# Patient Record
Sex: Male | Born: 1965 | Race: Black or African American | Hispanic: No | Marital: Married | Smoking: Never smoker
Health system: Southern US, Community
[De-identification: ages and names within clinical notes are randomized; demographics above are authoritative.]

## PROBLEM LIST (undated history)

## (undated) DIAGNOSIS — G4733 Obstructive sleep apnea (adult) (pediatric): Secondary | ICD-10-CM

## (undated) DIAGNOSIS — T7840XA Allergy, unspecified, initial encounter: Secondary | ICD-10-CM

## (undated) DIAGNOSIS — Z800 Family history of malignant neoplasm of digestive organs: Secondary | ICD-10-CM

## (undated) DIAGNOSIS — I10 Essential (primary) hypertension: Secondary | ICD-10-CM

## (undated) DIAGNOSIS — E669 Obesity, unspecified: Secondary | ICD-10-CM

## (undated) DIAGNOSIS — C189 Malignant neoplasm of colon, unspecified: Secondary | ICD-10-CM

## (undated) DIAGNOSIS — E785 Hyperlipidemia, unspecified: Secondary | ICD-10-CM

## (undated) DIAGNOSIS — G4730 Sleep apnea, unspecified: Secondary | ICD-10-CM

## (undated) DIAGNOSIS — Z9989 Dependence on other enabling machines and devices: Secondary | ICD-10-CM

## (undated) HISTORY — DX: Obesity, unspecified: E66.9

## (undated) HISTORY — DX: Essential (primary) hypertension: I10

## (undated) HISTORY — DX: Obstructive sleep apnea (adult) (pediatric): G47.33

## (undated) HISTORY — DX: Dependence on other enabling machines and devices: Z99.89

## (undated) HISTORY — DX: Hyperlipidemia, unspecified: E78.5

## (undated) HISTORY — PX: WISDOM TOOTH EXTRACTION: SHX21

## (undated) HISTORY — DX: Allergy, unspecified, initial encounter: T78.40XA

## (undated) HISTORY — DX: Malignant neoplasm of colon, unspecified: C18.9

## (undated) HISTORY — DX: Sleep apnea, unspecified: G47.30

## (undated) HISTORY — DX: Family history of malignant neoplasm of digestive organs: Z80.0

---

## 2010-12-21 ENCOUNTER — Encounter: Payer: Self-pay | Admitting: Medical

## 2010-12-21 ENCOUNTER — Ambulatory Visit (INDEPENDENT_AMBULATORY_CARE_PROVIDER_SITE_OTHER): Payer: BC Managed Care – PPO | Admitting: Medical

## 2010-12-21 DIAGNOSIS — Z0000 Encounter for general adult medical examination without abnormal findings: Secondary | ICD-10-CM

## 2010-12-21 DIAGNOSIS — I10 Essential (primary) hypertension: Secondary | ICD-10-CM

## 2010-12-21 LAB — POCT URINALYSIS DIPSTICK
Blood, UA: NEGATIVE
Leukocytes, UA: NEGATIVE
Nitrite, UA: NEGATIVE
Protein, UA: NEGATIVE
pH, UA: 6

## 2010-12-21 LAB — CBC WITH DIFFERENTIAL/PLATELET
Eosinophils Relative: 2 % (ref 0–5)
HCT: 47.1 % (ref 39.0–52.0)
Hemoglobin: 16.1 g/dL (ref 13.0–17.0)
Lymphocytes Relative: 46 % (ref 12–46)
Lymphs Abs: 2.2 10*3/uL (ref 0.7–4.0)
MCV: 86.3 fL (ref 78.0–100.0)
Monocytes Absolute: 0.7 10*3/uL (ref 0.1–1.0)
Monocytes Relative: 14 % — ABNORMAL HIGH (ref 3–12)
RBC: 5.46 MIL/uL (ref 4.22–5.81)
RDW: 13.6 % (ref 11.5–15.5)
WBC: 4.9 10*3/uL (ref 4.0–10.5)

## 2010-12-21 LAB — LIPID PANEL
HDL: 45 mg/dL (ref >39)
LDL Cholesterol: 106 mg/dL — ABNORMAL HIGH (ref 0–99)
Total CHOL/HDL Ratio: 3.9 Ratio

## 2010-12-21 LAB — COMPREHENSIVE METABOLIC PANEL
ALT: 28 U/L (ref 0–53)
CO2: 25 mEq/L (ref 19–32)
Creat: 0.94 mg/dL (ref 0.50–1.35)
Total Bilirubin: 1 mg/dL (ref 0.3–1.2)

## 2010-12-21 MED ORDER — HYDROCHLOROTHIAZIDE 25 MG PO TABS: 25.0000 mg | ORAL_TABLET | Freq: Every day | ORAL | Status: DC

## 2010-12-21 NOTE — Progress Notes (Signed)
Subjective:   HPI  Edward Patterson is a 45 y.o. male who presents for a complete physical.  Was seeing family doctor in [place] prior that had him on HCTZ, but he has been out of this for months.  Otherwise he has been lifting weight, working, and in normal state of health.  No other c/o.   Reviewed their medical, surgical, family, social, medication, and allergy history and updated chart as appropriate.  Past Medical History  Diagnosis Date  . Hypertension   . Obstructive sleep apnea on CPAP     CPAP  . Allergy     History reviewed. No pertinent past surgical history.  Family History  Problem Relation Age of Onset  . Hypertension Mother   . Diabetes Maternal Aunt   . Cancer Neg Hx   . Heart disease Neg Hx   . Stroke Neg Hx   . Hyperlipidemia Neg Hx     History   Social History  . Marital Status: Single    Spouse Name: N/A    Number of Children: N/A  . Years of Education: N/A   Occupational History  . Not on file.   Social History Main Topics  . Smoking status: Never Smoker   . Smokeless tobacco: Never Used  . Alcohol Use: 3.0 oz/week    6 drink(s) per week  . Drug Use: No  . Sexually Active: Not on file     single, engaged, 1 child, exercise 3-4 days/wk, weights, walks a lot at work, works in housekeeping at [employer]; prior surgical tech experience   Other Topics Concern  . Not on file   Social History Narrative  . No narrative on file    No current outpatient prescriptions on file prior to visit.    No Known Allergies   Review of Systems Constitutional: denies fever, chills, sweats, unexpected weight change, anorexia, fatigue Allergy: negative; denies recent sneezing, itching, congestion Dermatology: denies changing moles, rash, lumps, new worrisome lesions ENT: no runny nose, ear pain, sore throat, hoarseness, sinus pain, teeth pain, tinnitus, hearing loss, epistaxis Cardiology: denies chest pain, palpitations, edema, orthopnea, paroxysmal nocturnal  dyspnea Respiratory: denies cough, shortness of breath, dyspnea on exertion, wheezing, hemoptysis Gastroenterology: denies abdominal pain, nausea, vomiting, diarrhea, constipation, blood in stool, changes in bowel movement, dysphagia Hematology: denies bleeding or bruising problems Musculoskeletal: denies arthralgias, myalgias, joint swelling, back pain, neck pain, cramping, gait changes Ophthalmology: denies vision changes, eye redness, itching, discharge Urology: denies dysuria, difficulty urinating, hematuria, urinary frequency, urgency, incontinence Neurology: no headache, weakness, tingling, numbness, speech abnormality, memory loss, falls, dizziness Psychology: denies depressed mood, agitation, sleep problems     Objective:   Physical Exam  Filed Vitals:   12/21/10 1354  BP: 130/90  Pulse: 88  Temp: 98.1 F (36.7 C)  Resp: 20    General appearance: alert, no distress, WD/WN, AA male, overweight Skin: warm, dry, few brown raised benign appearing lesions, small of left face HEENT: normocephalic, conjunctiva/corneas normal, sclerae anicteric, PERRLA, EOMi, nares patent, no discharge or erythema, pharynx normal Oral cavity: MMM, tongue normal, teeth normal Neck: supple, no lymphadenopathy, no thyromegaly, no masses, normal ROM, no JVD or bruits Chest: non tender, normal shape and expansion Heart: RRR, normal S1, S2, no murmurs Lungs: CTA bilaterally, no wheezes, rhonchi, or rales Abdomen: +bs, soft, non tender, non distended, no masses, no hepatomegaly, no splenomegaly, no bruits Back: non tender, normal ROM, no scoliosis Musculoskeletal: upper extremities non tender, no obvious deformity, normal ROM throughout, lower extremities  non tender, no obvious deformity, normal ROM throughout Extremities: no edema, no cyanosis, no clubbing Pulses: 2+ symmetric, upper and lower extremities, normal cap refill Neurological: alert, oriented x 3, CN2-12 intact, strength normal upper  extremities and lower extremities, sensation normal throughout, DTRs 2+ throughout, no cerebellar signs, gait normal Psychiatric: normal affect, behavior normal, pleasant  GU: nontender, no hernia, no mass Rectal: anus normal appearing, prostate WNL, no masses, occult negative stool  Assessment :    Encounter Diagnoses  Name Primary?  . General medical examination Yes  . Essential hypertension, benign     Plan:    Physical exam - discussed healthy lifestyle, diet, exercise, preventative care, vaccinations, and addressed their concerns.  Advised he use lifestyle changes to lose weight.  Will request records from prior practice for office notes, labs, and what sounds like prior sigmoidoscopy for blood in stool a few years ago.  He notes last Tdap updated within the last few years.  HTN - restart HCTZ.  He has f/u with his ophthalmologist tomorrow.   Will call with lab results.

## 2010-12-21 NOTE — Patient Instructions (Signed)
Preventative Care for Adults, Male       REGULAR HEALTH EXAMS:  A routine yearly physical is a good way to check in with your primary care provider about your health and preventive screening. It is also an opportunity to share updates about your health and any concerns you have, and receive a thorough all-over exam.   Most health insurance companies pay for at least some preventative services.  Check with your health plan for specific coverages.  WHAT PREVENTATIVE SERVICES DO MEN NEED?  Adult men should have their weight and blood pressure checked regularly.   Men age 35 and older should have their cholesterol levels checked regularly.  Beginning at age 50 and continuing to age 75, men should be screened for colorectal cancer.  Certain people should may need continued testing until age 85.  Other cancer screening may include exams for testicular and prostate cancer.  Updating vaccinations is part of preventative care.  Vaccinations help protect against diseases such as the flu.  Lab tests are generally done as part of preventative care to screen for anemia and blood disorders, to screen for problems with the kidneys and liver, to screen for bladder problems, to check blood sugar, and to check your cholesterol level.  Preventative services generally include counseling about diet, exercise, avoiding tobacco, drugs, excessive alcohol consumption, and sexually transmitted infections.    GENERAL RECOMMENDATIONS FOR GOOD HEALTH:  Healthy diet:  Eat a variety of foods, including fruit, vegetables, animal or vegetable protein, such as meat, fish, chicken, and eggs, or beans, lentils, tofu, and grains, such as rice.  Drink plenty of water daily.  Decrease saturated fat in the diet, avoid lots of red meat, processed foods, sweets, fast foods, and fried foods.  Exercise:  Aerobic exercise helps maintain good heart health. At least 30-40 minutes of moderate-intensity exercise is recommended.  For example, a brisk walk that increases your heart rate and breathing. This should be done on most days of the week.   Find a type of exercise or a variety of exercises that you enjoy so that it becomes a part of your daily life.  Examples are running, walking, swimming, water aerobics, and biking.  For motivation and support, explore group exercise such as aerobic class, spin class, Zumba, Yoga,or  martial arts, etc.    Set exercise goals for yourself, such as a certain weight goal, walk or run in a race such as a 5k walk/run.  Speak to your primary care provider about exercise goals.  Disease prevention:  If you smoke or chew tobacco, find out from your caregiver how to quit. It can literally save your life, no matter how long you have been a tobacco user. If you do not use tobacco, never begin.   Maintain a healthy diet and normal weight. Increased weight leads to problems with blood pressure and diabetes.   The Body Mass Index or BMI is a way of measuring how much of your body is fat. Having a BMI above 27 increases the risk of heart disease, diabetes, hypertension, stroke and other problems related to obesity. Your caregiver can help determine your BMI and based on it develop an exercise and dietary program to help you achieve or maintain this important measurement at a healthful level.  High blood pressure causes heart and blood vessel problems.  Persistent high blood pressure should be treated with medicine if weight loss and exercise do not work.   Fat and cholesterol leaves deposits in your arteries   that can block them. This causes heart disease and vessel disease elsewhere in your body.  If your cholesterol is found to be high, or if you have heart disease or certain other medical conditions, then you may need to have your cholesterol monitored frequently and be treated with medication.   Ask if you should have a stress test if your history suggests this. A stress test is a test done on  a treadmill that looks for heart disease. This test can find disease prior to there being a problem.  Avoid drinking alcohol in excess (more than two drinks per day).  Avoid use of street drugs. Do not share needles with anyone. Ask for professional help if you need assistance or instructions on stopping the use of alcohol, cigarettes, and/or drugs.  Brush your teeth twice a day with fluoride toothpaste, and floss once a day. Good oral hygiene prevents tooth decay and gum disease. The problems can be painful, unattractive, and can cause other health problems. Visit your dentist for a routine oral and dental check up and preventive care every 6-12 months.   Look at your skin regularly.  Use a mirror to look at your back. Notify your caregivers of changes in moles, especially if there are changes in shapes, colors, a size larger than a pencil eraser, an irregular border, or development of new moles.  Safety:  Use seatbelts 100% of the time, whether driving or as a passenger.  Use safety devices such as hearing protection if you work in environments with loud noise or significant background noise.  Use safety glasses when doing any work that could send debris in to the eyes.  Use a helmet if you ride a bike or motorcycle.  Use appropriate safety gear for contact sports.  Talk to your caregiver about gun safety.  Use sunscreen with a SPF (or skin protection factor) of 15 or greater.  Lighter skinned people are at a greater risk of skin cancer. Don't forget to also wear sunglasses in order to protect your eyes from too much damaging sunlight. Damaging sunlight can accelerate cataract formation.   Practice safe sex. Use condoms. Condoms are used for birth control and to help reduce the spread of sexually transmitted infections (or STIs).  Some of the STIs are gonorrhea (the clap), chlamydia, syphilis, trichomonas, herpes, HPV (human papilloma virus) and HIV (human immunodeficiency virus) which causes AIDS.  The herpes, HIV and HPV are viral illnesses that have no cure. These can result in disability, cancer and death.   Keep carbon monoxide and smoke detectors in your home functioning at all times. Change the batteries every 6 months or use a model that plugs into the wall.   Vaccinations:  Stay up to date with your tetanus shots and other required immunizations. You should have a booster for tetanus every 10 years. Be sure to get your flu shot every year, since 5%-20% of the [place] population comes down with the flu. The flu vaccine changes each year, so being vaccinated once is not enough. Get your shot in the fall, before the flu season peaks.   Other vaccines to consider:  Pneumococcal vaccine to protect against certain types of pneumonia.  This is normally recommended for adults age 65 or older.  However, adults younger than 47 years old with certain underlying conditions such as diabetes, heart or lung disease should also receive the vaccine.  Shingles vaccine to protect against Varicella Zoster if you are older than age 60, or younger   than 47 years old with certain underlying illness.  Hepatitis A vaccine to protect against a form of infection of the liver by a virus acquired from food.  Hepatitis B vaccine to protect against a form of infection of the liver by a virus acquired from blood or body fluids, particularly if you work in health care.  If you plan to travel internationally, check with your local health department for specific vaccination recommendations.  Cancer Screening:  Most routine colon cancer screening begins at the age of 50. On a yearly basis, doctors may provide special easy to use take-home tests to check for hidden blood in the stool. Sigmoidoscopy or colonoscopy can detect the earliest forms of colon cancer and is life saving. These tests use a small camera at the end of a tube to directly examine the colon. Speak to your caregiver about this at age 50, when routine  screening begins (and is repeated every 5 years unless early forms of pre-cancerous polyps or small growths are found).   At the age of 50 men usually start screening for prostate cancer every year. Screening may begin at a younger age for those with higher risk. Those at higher risk include African-Americans or having a family history of prostate cancer. There are two types of tests for prostate cancer:   Prostate-specific antigen (PSA) testing. Recent studies raise questions about prostate cancer using PSA and you should discuss this with your caregiver.   Digital rectal exam (in which your doctor's lubricated and gloved finger feels for enlargement of the prostate through the anus).   Screening for testicular cancer.  Do a monthly exam of your testicles. Gently roll each testicle between your thumb and fingers, feeling for any abnormal lumps. The best time to do this is after a hot shower or bath when the tissues are looser. Notify your caregivers of any lumps, tenderness or changes in size or shape immediately.     

## 2010-12-23 ENCOUNTER — Telehealth: Payer: Self-pay | Admitting: Medical

## 2010-12-23 NOTE — Telephone Encounter (Addendum)
Message copied by Dorthula Perfect on Fri Dec 23, 2010  1:01 PM ------      Message from: Waldo, DAVID S      Created: Fri Dec 23, 2010  7:45 AM       Labs all normal (liver, kidney, electrolytes, urine, thyroid, cholesterol).  Ask him to take his BP med daily, work on diet and cardiac exercise in addition to weight training to help lose weight.  I want to see him back in 29mo on BP and weight.   Pt notified of lab results and was advised to take BP medication and to exercise for weight lose.  Pt scheduled on 03-20-11 at 2:30pm for follow up on BP and weight lose.  CM, LPN

## 2010-12-23 NOTE — Telephone Encounter (Signed)
Pt wants lab results and wants to know if you recd correspondence from Dr. Vedia Coffer

## 2011-03-21 ENCOUNTER — Encounter: Payer: Self-pay | Admitting: Medical

## 2011-03-21 ENCOUNTER — Ambulatory Visit (INDEPENDENT_AMBULATORY_CARE_PROVIDER_SITE_OTHER): Payer: BC Managed Care – PPO | Admitting: Medical

## 2011-03-21 VITALS — BP 160/90 | HR 84 | Temp 98.1°F | Resp 18 | Wt 268.0 lb

## 2011-03-21 DIAGNOSIS — E669 Obesity, unspecified: Secondary | ICD-10-CM | POA: Insufficient documentation

## 2011-03-21 DIAGNOSIS — I10 Essential (primary) hypertension: Secondary | ICD-10-CM

## 2011-03-21 MED ORDER — TRIAMTERENE-HCTZ 37.5-25 MG PO CAPS: 1.0000 | ORAL_CAPSULE | Freq: Every day | ORAL | Status: DC

## 2011-03-21 MED ORDER — NEBIVOLOL HCL 5 MG PO TABS: 5.0000 mg | ORAL_TABLET | Freq: Every day | ORAL | Status: DC

## 2011-03-21 NOTE — Progress Notes (Signed)
Subjective:   HPI  Edward Patterson is a 45 y.o. male who presents for recheck on blood pressure and weight.   At his visit here in August for complete physical, I started him back on HCTZ 25mg  daily.  We received prior records since last visit.  He is taking the HCTZ 25mg  daily, but since last visit hasn't been able to make significant lifestyle changes.  Goes into work 5am, goes to gym some in the mornings.  He recently changed his schedule and job, and not able to exercise like he was.  Not adherent to low salt diet.  He has made some diet changes though.  He is using his CPAP machine.   No other aggravating or relieving factors.    No other c/o.  The following portions of the patient's history were reviewed and updated as appropriate: allergies, current medications, past family history, past medical history, past social history, past surgical history and problem list.  Past Medical History  Diagnosis Date  . Hypertension   . Obstructive sleep apnea on CPAP     CPAP  . Allergy     Review of Systems Constitutional: -fever, -chills, -sweats, -unexpected -weight change,-fatigue ENT: -runny nose, -ear pain, -sore throat Cardiology:  -chest pain, -palpitations, -edema Respiratory: -cough, -shortness of breath, -wheezing Gastroenterology: -abdominal pain, +nausea, -vomiting, -diarrhea, -constipation Hematology: -bleeding or bruising problems Musculoskeletal: -arthralgias, -myalgias, -joint swelling, -back pain Ophthalmology: +vision changes Urology: -dysuria, -difficulty urinating, -hematuria, -urinary frequency, -urgency Neurology: -headache, -weakness, -tingling, -numbness    Objective:   Physical Exam  Filed Vitals:   03/21/11 1425  BP: 160/90  Pulse: 84  Temp: 98.1 F (36.7 C)  Resp: 18    General appearance: alert, no distress, WD/WN, obese  Oral cavity: MMM, no lesions Neck: supple, no lymphadenopathy, no thyromegaly, no masses Heart: RRR, normal S1, S2, no  murmurs Lungs: CTA bilaterally, no wheezes, rhonchi, or rales Pulses: 2+ symmetric, upper and lower extremities, normal cap refill   Assessment and Plan :    Encounter Diagnoses  Name Primary?  Marland Kitchen Unspecified essential hypertension Yes  . Obesity    HTN - again today spent lots of time discussing diet, healthy eating, exercise, low salt diet, and medications.  I reviewed his prior medications, and he had been on Bystolic 5 mg a day and Dyazide 37.5/25mg  daily. We will stop his current HCTZ and restart his prior regimen which apparently had worked better.  Obesity - spent a lot of time talking about diet and exercise today.  He agrees to work harder on this, we'll make some necessary changes, and recheck here in one month on blood pressure and weight.   Follow-up  21mo

## 2011-03-21 NOTE — Patient Instructions (Signed)
Stop Hydrochlorothiazide 25mg  daily.  Begin Dyazide 37.5/25mg  combo once daily in the morning.  After 1 week if no problems, begin Bystolic 5mg  at bedtime.  Lets recheck your blood pressure again in 1 month here.  Try these diet modifications:  Try and eat raw or cooked vegetable each meal, especially green leafy vegetables.  Try and make this the MAIN DISH  Eat 4 servings of fruit daily  Eat 1 cup of beans daily  Eat 1 serving of whole grains daily such as oatmeal  Limit animal products such as meat, milk, cheese  AVOID salt  Stay away from potatoes, salt, fast food, fried foods, candy, sweets.  Lets make these changes, and really work on your diet and exercise to lose weight.

## 2011-03-22 ENCOUNTER — Encounter: Payer: Self-pay | Admitting: Family Medicine

## 2011-04-10 ENCOUNTER — Emergency Department (INDEPENDENT_AMBULATORY_CARE_PROVIDER_SITE_OTHER)
Admission: EM | Admit: 2011-04-10 | Discharge: 2011-04-10 | Disposition: A | Discharge status: Home / Self Care | Payer: BC Managed Care – PPO | Source: Home / Self Care

## 2011-04-10 ENCOUNTER — Encounter (HOSPITAL_COMMUNITY): Payer: Self-pay

## 2011-04-10 DIAGNOSIS — R040 Epistaxis: Secondary | ICD-10-CM

## 2011-04-10 NOTE — ED Notes (Signed)
States he has a dry nose and haas been having a nosebleed since last PM; uses C-PAP at night ; worried since bleeding was hard to stop this AM

## 2011-04-10 NOTE — ED Provider Notes (Signed)
History     CSN: [id number] Arrival date & time: 04/10/2011 11:30 AM   None     Chief Complaint  Patient presents with  . Epistaxis    (Consider location/radiation/quality/duration/timing/severity/associated sxs/prior treatment) HPI Comments: Pt states he had noticed that the Lt side of his nose was tender the last couple of days. Had some minor  bleeding during the night last night from Lt nostril, and then this morning had an active nosebleed. He wears a CPAP at night with a humidifier, but does not typically have problems with nosebleeds. No nasal congestion symptoms.   Patient is a 45 y.o. male presenting with nosebleeds. The history is provided by the patient.  Epistaxis  This is a new problem. The current episode started 6 to 12 hours ago. Episode frequency: once. The problem has been resolved. The problem is associated with an unknown factor. The bleeding has been from the left nare. He has tried nothing for the symptoms. His past medical history does not include bleeding disorder, colds, nose-picking or frequent nosebleeds.    Past Medical History  Diagnosis Date  . Hypertension   . Obstructive sleep apnea on CPAP     CPAP  . Allergy     History reviewed. No pertinent past surgical history.  Family History  Problem Relation Age of Onset  . Hypertension Mother   . Diabetes Maternal Aunt   . Cancer Neg Hx   . Heart disease Neg Hx   . Stroke Neg Hx   . Hyperlipidemia Neg Hx     History  Substance Use Topics  . Smoking status: Never Smoker   . Smokeless tobacco: Never Used  . Alcohol Use: 3.0 oz/week    6 drink(s) per week      Review of Systems  Constitutional: Negative for fever and chills.  HENT: Positive for nosebleeds. Negative for ear pain, congestion, rhinorrhea and postnasal drip.   Respiratory: Negative for cough.     Allergies  Review of patient's allergies indicates no known allergies.  Home Medications   Current Outpatient Rx  Name Route  Sig Dispense Refill  . NEBIVOLOL HCL 5 MG PO TABS Oral Take 1 tablet (5 mg total) by mouth daily. 30 tablet 3  . TRIAMTERENE-HCTZ 37.5-25 MG PO CAPS Oral Take 1 each (1 capsule total) by mouth daily. 30 capsule 3    BP 150/69  Pulse 84  Temp(Src) 98.5 F (36.9 C) (Oral)  Resp 16  SpO2 99%  Physical Exam  Nursing note and vitals reviewed. Constitutional: He appears well-developed and well-nourished. No distress.  HENT:  Head: Normocephalic and atraumatic.  Right Ear: Tympanic membrane, external ear and ear canal normal.  Left Ear: Tympanic membrane, external ear and ear canal normal.  Nose: Nose normal. No mucosal edema, rhinorrhea, nasal deformity or septal deviation. No epistaxis.    Mouth/Throat: Uvula is midline, oropharynx is clear and moist and mucous membranes are normal. No oropharyngeal exudate, posterior oropharyngeal edema or posterior oropharyngeal erythema.  Neck: Neck supple.  Cardiovascular: Normal rate, regular rhythm and normal heart sounds.   Pulmonary/Chest: Effort normal and breath sounds normal. No respiratory distress.  Lymphadenopathy:    He has no cervical adenopathy.  Neurological: He is alert.  Skin: Skin is warm and dry.  Psychiatric: He has a normal mood and affect.    ED Course  EPISTAXIS MANAGEMENT Date/Time: 04/10/2011 12:36 PM Performed by: Melody Comas Authorized by: Melody Comas Consent: Verbal consent obtained. Written consent not obtained. Risks  and benefits: risks, benefits and alternatives were discussed Consent given by: patient Patient understanding: patient states understanding of the procedure being performed Patient sedated: no Treatment site: left Kiesselbach's area Repair method: silver nitrate Treatment complexity: simple Patient tolerance: Patient tolerated the procedure well with no immediate complications.   (including critical care time)  Labs Reviewed - No data to display No results found.   1. Epistaxis        MDM          Melody Comas, PA 04/10/11 1247

## 2011-04-10 NOTE — ED Provider Notes (Signed)
Medical screening examination/treatment/procedure(s) were performed by non-physician practitioner and as supervising physician I was immediately available for consultation/collaboration.  Emmamarie Kluender   Valree Feild, MD 04/10/11 1327 

## 2011-07-24 ENCOUNTER — Other Ambulatory Visit: Payer: Self-pay | Admitting: Medical

## 2011-07-24 NOTE — Telephone Encounter (Signed)
PATIENT NEEDS TO SCHEDULE AN OV FOR FASTING LABS.

## 2011-10-16 ENCOUNTER — Other Ambulatory Visit: Payer: Self-pay | Admitting: Medical

## 2011-10-16 NOTE — Telephone Encounter (Signed)
Patient needs a office visit before his medication runs out because he will not receive anymore refills.

## 2011-11-23 ENCOUNTER — Other Ambulatory Visit: Payer: Self-pay | Admitting: Medical

## 2011-12-15 ENCOUNTER — Encounter: Payer: Self-pay | Admitting: Medical

## 2011-12-15 ENCOUNTER — Ambulatory Visit (INDEPENDENT_AMBULATORY_CARE_PROVIDER_SITE_OTHER): Payer: BC Managed Care – PPO | Admitting: Medical

## 2011-12-15 VITALS — BP 160/100 | HR 68 | Temp 98.3°F | Resp 16 | Wt 262.0 lb

## 2011-12-15 DIAGNOSIS — E669 Obesity, unspecified: Secondary | ICD-10-CM

## 2011-12-15 DIAGNOSIS — I10 Essential (primary) hypertension: Secondary | ICD-10-CM

## 2011-12-15 MED ORDER — NEBIVOLOL HCL 10 MG PO TABS: 10.0000 mg | ORAL_TABLET | Freq: Every day | ORAL | Status: DC

## 2011-12-15 NOTE — Progress Notes (Signed)
Subjective:   HPI  Edward Patterson is a 46 y.o. male who presents for recheck on blood pressure and weight.   Been compliant with medication.  Not checking BPs at home, has no cuff.  Not exercising specifically other than staying active on the job.   Working 2 jobs, full time and [employer], part time 7 hours daily in the evening until September.  Tries to eat relatively healthy, but still drinking some soda, tends to eat good for breakfast and lunch, but typically eats more unhealthy stuff in the evenings.   No other aggravating or relieving factors.    No other c/o.  The following portions of the patient's history were reviewed and updated as appropriate: allergies, current medications, past family history, past medical history, past social history, past surgical history and problem list.  Past Medical History  Diagnosis Date  . Hypertension   . Obstructive sleep apnea on CPAP     CPAP  . Allergy     Review of Systems As noted in HPI    Objective:   Physical Exam  Filed Vitals:   12/15/11 1417  BP: 160/100  Pulse: 68  Temp: 98.3 F (36.8 C)  Resp: 16    General appearance: alert, no distress, WD/WN, obese AA male Oral cavity: MMM, no lesions Neck: supple, no lymphadenopathy, no thyromegaly, no masses Heart: RRR, normal S1, S2, no murmurs Lungs: CTA bilaterally, no wheezes, rhonchi, or rales Pulses: 2+ symmetric, upper and lower extremities, normal cap refill   Assessment and Plan :    Encounter Diagnoses  Name Primary?  . Essential hypertension, benign Yes  . Obesity    HTN - again today spent lots of time discussing diet, healthy eating, exercise, low salt diet, and medications.  I increased his Bystolic to 10mg  daily, c/t Dyazide 37.5/25mg  daily.  Consider repeat labs next visit.  If not much improved, consider changing therapy to different medication.  Obesity - spent a lot of time talking about diet and exercise today.  Recheck here in one month on blood pressure  and weight.   Follow-up 81mo

## 2011-12-15 NOTE — Patient Instructions (Signed)
Blood pressure too high!  Recommendations:  Get you pharmacist to help get a blood pressure cuff, and start checking your pressures 3 times a week, and write these down so I can see them.  Continue Dyazide once daily  Increase Bystolic to 10mg  daily.  Finish your current 5mg  Bystolic by taking 2 daily, and when you run out, begin the 10mg  samples, once daily  LOSE WEIGHT!  Begin a daily 81mg  baby aspirin, such as Bayer aspirin  Try and get some exercise daily  EAT healthier!  Avoid added salt, avoid added sugar, drink more water, eat more fruits and vegetables, eat some beans on whole grains on a regularly basis.  Avoid fast food, soda, fried foods.

## 2011-12-23 ENCOUNTER — Other Ambulatory Visit: Payer: Self-pay | Admitting: Medical

## 2012-03-17 ENCOUNTER — Other Ambulatory Visit: Payer: Self-pay | Admitting: Medical

## 2012-05-25 ENCOUNTER — Other Ambulatory Visit: Payer: Self-pay | Admitting: Medical

## 2012-09-10 ENCOUNTER — Telehealth: Payer: Self-pay | Admitting: Internal Medicine

## 2012-09-10 NOTE — Telephone Encounter (Signed)
Pt is given samples of bystolic 10mg  until he comes in for his appt at the end of may

## 2012-09-30 ENCOUNTER — Encounter: Payer: Self-pay | Admitting: Medical

## 2012-09-30 ENCOUNTER — Ambulatory Visit (INDEPENDENT_AMBULATORY_CARE_PROVIDER_SITE_OTHER): Payer: BC Managed Care – PPO | Admitting: Medical

## 2012-09-30 VITALS — BP 130/90 | HR 72 | Temp 97.9°F | Resp 16 | Ht 71.0 in | Wt 260.0 lb

## 2012-09-30 DIAGNOSIS — G4733 Obstructive sleep apnea (adult) (pediatric): Secondary | ICD-10-CM

## 2012-09-30 DIAGNOSIS — Z0000 Encounter for general adult medical examination without abnormal findings: Secondary | ICD-10-CM

## 2012-09-30 DIAGNOSIS — I10 Essential (primary) hypertension: Secondary | ICD-10-CM

## 2012-09-30 DIAGNOSIS — E669 Obesity, unspecified: Secondary | ICD-10-CM

## 2012-09-30 LAB — POCT URINALYSIS DIPSTICK
Bilirubin, UA: NEGATIVE
Glucose, UA: NEGATIVE
Ketones, UA: NEGATIVE
Leukocytes, UA: NEGATIVE
Nitrite, UA: NEGATIVE
pH, UA: 7

## 2012-09-30 NOTE — Progress Notes (Signed)
Subjective:   HPI  Edward Patterson is a 47 y.o. male who presents for a complete physical.   Preventative care: Last ophthalmology visit:yes- 06/2012- Dr. Harriette Bouillon Last dental visit:yes- Dr. Petra Kuba Last colonoscopy:n/a Last prostate exam: n/a Last ZOX:WRUEA at the dentist office Last labs:12/2010  Prior vaccinations: TD or Tdap:up to date- unsure of the date Influenza:2013  Pneumococcal:n/a Shingles/Zostavax:n/a  Advanced directive:n/a Health care power of attorney:n/a Living will:n/a  Concerns: HTN - compliant, but there is some issue with the pharmacy where he has only been getting bystolic 5mg .  He doubles up to equal 10mg  daily, but runs out halfway through the month every month.  HTN diagnosed 2001.  Reviewed their medical, surgical, family, social, medication, and allergy history and updated chart as appropriate.   Past Medical History  Diagnosis Date  . Hypertension   . Obstructive sleep apnea on CPAP     CPAP  . Allergy     Past Surgical History  Procedure Laterality Date  . Wisdom tooth extraction      Family History  Problem Relation Age of Onset  . Hypertension Mother   . Diabetes Maternal Aunt   . Cancer Neg Hx   . Heart disease Neg Hx   . Stroke Neg Hx   . Hyperlipidemia Neg Hx     History   Social History  . Marital Status: Single    Spouse Name: N/A    Number of Children: N/A  . Years of Education: N/A   Occupational History  . Not on file.   Social History Main Topics  . Smoking status: Never Smoker   . Smokeless tobacco: Never Used  . Alcohol Use: 2.4 oz/week    4 Cans of beer per week  . Drug Use: No  . Sexually Active: Not on file     Comment: single, engaged, 1 child, exercise 3-4 days/wk, weights, walks a lot at work, works in housekeeping at [employer]; prior surgical tech experience   Other Topics Concern  . Not on file   Social History Narrative   Married, 23yo child, exercise - working 2 jobs, physically active on the  job.   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 Sometimes uses the gym at [facility]    Current Outpatient Prescriptions on File Prior to Visit  Medication Sig Dispense Refill  . nebivolol (BYSTOLIC) 10 MG tablet Take 1 tablet (10 mg total) by mouth daily.  30 tablet  0  . triamterene-hydrochlorothiazide (DYAZIDE) 37.5-25 MG per capsule TAKE 1 EACH (1 CAPSULE TOTAL) BY MOUTH DAILY.  30 capsule  4   No current facility-administered medications on file prior to visit.    No Known Allergies   Review of Systems Constitutional: -fever, -chills, -sweats, -unexpected weight change, -decreased appetite, -fatigue Allergy: -sneezing, -itching, -congestion Dermatology: -changing moles, --rash, -lumps ENT: -runny nose, -ear pain, -sore throat, -hoarseness, -sinus pain, -teeth pain, - ringing in ears, -hearing loss, -nosebleeds Cardiology: -chest pain, -palpitations, -swelling, -difficulty breathing when lying flat, -waking up short of breath Respiratory: -cough, -shortness of breath, -difficulty breathing with exercise or exertion, -wheezing, -coughing up blood Gastroenterology: -abdominal pain, -nausea, -vomiting, -diarrhea, -constipation, -blood in stool, -changes in bowel movement, -difficulty swallowing or eating Hematology: -bleeding, -bruising  Musculoskeletal: -joint aches, -muscle aches, -joint swelling, -back pain, -neck pain, -cramping, -changes in gait Ophthalmology: denies vision changes, eye redness, itching, discharge Urology: -burning with urination, -difficulty urinating, -blood in urine, -urinary frequency, -urgency, -incontinence Neurology: -headache, -weakness, -tingling, -numbness, -memory  loss, -falls, -dizziness Psychology: -depressed mood, -agitation, -sleep problems     Objective:   Physical Exam  Reviewed nurse notes and vitals  General appearance: alert, no distress, WD/WN, obese AA male Skin: scattered pedunculated brown well defined lesions  of bilat orbits and neck, suggestive of skin tags, left lateral upper arm and forearm with 2 patches of rectangular scar HEENT: normocephalic, conjunctiva/corneas normal, sclerae anicteric, PERRLA, EOMi, nares patent, no discharge or erythema, pharynx normal Oral cavity: MMM, tongue normal, teeth in good repair Neck: supple, no lymphadenopathy, no thyromegaly, no masses, normal ROM, no bruits Chest: non tender, normal shape and expansion Heart: RRR, normal S1, S2, no murmurs Lungs: CTA bilaterally, no wheezes, rhonchi, or rales Abdomen: +bs, soft, non tender, non distended, no masses, no hepatomegaly, no splenomegaly, no bruits Back: non tender, normal ROM, no scoliosis Musculoskeletal: upper extremities non tender, no obvious deformity, normal ROM throughout, lower extremities non tender, no obvious deformity, normal ROM throughout Extremities: no edema, no cyanosis, no clubbing Pulses: 2+ symmetric, upper and lower extremities, normal cap refill Neurological: alert, oriented x 3, CN2-12 intact, strength normal upper extremities and lower extremities, sensation normal throughout, DTRs 2+ throughout, no cerebellar signs, gait normal Psychiatric: normal affect, behavior normal, pleasant  GU: normal male external genitalia, nontender, no masses, no hernia, no lymphadenopathy Rectal: anus normal tone, prostate WNL, no nodules     Adult ECG Report  Indication: hypertension, physical   Rate: 68bpm  Rhythm: normal sinus rhythm  QRS Axis: 65 degrees  PR Interval:  QRS Duration: 98ms  QTc:  Conduction Disturbances: none  Other Abnormalities: voltage criteria for LVH  Patient's cardiac risk factors are: family history of premature cardiovascular disease, hypertension, male gender and obesity (BMI >= 30 kg/m2).  EKG comparison: none  Narrative Interpretation: LVH, otherwise normal EKG  Assessment and Plan :      Encounter Diagnoses  Name Primary?  . Routine general medical  examination at a health care facility Yes  . Essential hypertension, benign   . Obesity, unspecified   . Obstructive sleep apnea     Physical exam - discussed healthy lifestyle, diet, exercise, preventative care, vaccinations, and addressed their concerns.  Labs today  HTN - c/t same medication, but resent 10mg  Bystolic, advised if any other issues at the pharmacy to call us back  Obesity - work on [employer], work on weight loss, c/t regular exercise  OSA - c/t CPAP nightly, weight loss advised   Follow-up pending labs

## 2012-10-01 ENCOUNTER — Telehealth: Payer: Self-pay | Admitting: Family Medicine

## 2012-10-01 ENCOUNTER — Telehealth: Payer: Self-pay | Admitting: Internal Medicine

## 2012-10-01 LAB — CBC
Hemoglobin: 15.8 g/dL (ref 13.0–17.0)
MCH: 28.3 pg (ref 26.0–34.0)
MCV: 83.7 fL (ref 78.0–100.0)
RBC: 5.58 MIL/uL (ref 4.22–5.81)

## 2012-10-01 LAB — LIPID PANEL
Cholesterol: 172 mg/dL (ref 0–200)
Total CHOL/HDL Ratio: 3.9 Ratio
VLDL: 32 mg/dL (ref 0–40)

## 2012-10-01 LAB — COMPREHENSIVE METABOLIC PANEL
Alkaline Phosphatase: 75 U/L (ref 39–117)
CO2: 27 mEq/L (ref 19–32)
Chloride: 100 mEq/L (ref 96–112)
Potassium: 3.7 mEq/L (ref 3.5–5.3)
Sodium: 137 mEq/L (ref 135–145)
Total Bilirubin: 0.7 mg/dL (ref 0.3–1.2)

## 2012-10-01 MED ORDER — NEBIVOLOL HCL 10 MG PO TABS: 10.0000 mg | ORAL_TABLET | Freq: Every day | ORAL | Status: DC

## 2012-10-01 NOTE — Telephone Encounter (Signed)
Message copied by Janeice Robinson on Tue Oct 01, 2012  4:05 PM ------      Message from: Jac Canavan      Created: Tue Oct 01, 2012  7:48 AM       Labs look good.  C/t same medications.             Please call his pharmacy about the bystolic 10mg .  He says pharmacy only gives him 5mg  tablets despite our script for 10mg .   Can you please resolve this.            Secondly, given his longstanding hypertension and strong family history of heart disease, I want to refer him for baseline cardiac evaluation.   ------

## 2012-10-01 NOTE — Telephone Encounter (Signed)
Pt states bystolic 10mg  was suppose to be sent to [facility] to the pharmacy

## 2012-10-01 NOTE — Telephone Encounter (Signed)
Refills was sent to his pharmacy. CLS

## 2012-10-01 NOTE — Addendum Note (Signed)
Addended by: Janeice Robinson on: 10/01/2012 03:32 PM   Modules accepted: Orders

## 2012-10-01 NOTE — Telephone Encounter (Signed)
Patient is aware of his lab results. CS  Patient is aware of his appointment to see Dr. Eulis Canner on November 12, 2012 @ 845 am. [facility] [phone]

## 2012-10-19 ENCOUNTER — Other Ambulatory Visit: Payer: Self-pay | Admitting: Medical

## 2012-10-29 ENCOUNTER — Other Ambulatory Visit: Payer: Self-pay | Admitting: Medical

## 2012-11-05 ENCOUNTER — Telehealth: Payer: Self-pay | Admitting: Internal Medicine

## 2012-11-05 MED ORDER — NEBIVOLOL HCL 10 MG PO TABS: 10.0000 mg | ORAL_TABLET | Freq: Every day | ORAL | Status: DC

## 2012-11-05 NOTE — Telephone Encounter (Signed)
Pt states that his bystolic was sent in for only 5mg  and needs to be sent in for 10mg 

## 2012-11-12 ENCOUNTER — Institutional Professional Consult (permissible substitution): Payer: BC Managed Care – PPO | Admitting: Cardiovascular Disease

## 2013-01-07 ENCOUNTER — Other Ambulatory Visit: Payer: Self-pay | Admitting: Medical

## 2013-01-27 ENCOUNTER — Institutional Professional Consult (permissible substitution): Payer: BC Managed Care – PPO | Admitting: Cardiovascular Disease

## 2013-03-20 ENCOUNTER — Institutional Professional Consult (permissible substitution): Payer: BC Managed Care – PPO | Admitting: Cardiovascular Disease

## 2013-03-21 ENCOUNTER — Encounter: Payer: Self-pay | Admitting: Cardiovascular Disease

## 2013-04-21 ENCOUNTER — Other Ambulatory Visit: Payer: Self-pay | Admitting: Family Medicine

## 2013-04-21 ENCOUNTER — Telehealth: Payer: Self-pay | Admitting: Medical

## 2013-04-21 MED ORDER — NEBIVOLOL HCL 10 MG PO TABS: 10.0000 mg | ORAL_TABLET | Freq: Every day | ORAL | Status: DC

## 2013-04-21 NOTE — Telephone Encounter (Signed)
Samples given per Karren Burly

## 2013-07-10 ENCOUNTER — Other Ambulatory Visit: Payer: Self-pay | Admitting: Medical

## 2013-07-18 ENCOUNTER — Encounter: Payer: Self-pay | Admitting: Medical

## 2013-07-18 ENCOUNTER — Ambulatory Visit (INDEPENDENT_AMBULATORY_CARE_PROVIDER_SITE_OTHER): Payer: BC Managed Care – PPO | Admitting: Medical

## 2013-07-18 VITALS — BP 152/90 | HR 88 | Temp 98.3°F | Resp 16 | Wt 272.0 lb

## 2013-07-18 DIAGNOSIS — M25469 Effusion, unspecified knee: Secondary | ICD-10-CM

## 2013-07-18 DIAGNOSIS — M25569 Pain in unspecified knee: Secondary | ICD-10-CM

## 2013-07-18 DIAGNOSIS — M25562 Pain in left knee: Secondary | ICD-10-CM

## 2013-07-18 NOTE — Patient Instructions (Signed)
  Thank you for giving me the opportunity to serve you today.    Your diagnosis today includes: Encounter Diagnosis  Name Primary?  . Knee pain, left Yes     Specific recommendations today include:  Go for x-ray  Begin over-the-counter Aleve one to 2 tablets twice daily for pain and inflammation and swelling  Use knee sleeve or Ace wrap over-the-counter for compression  Elevate the leg in the evening and use an ice pack to the knee  Try to limit activity on the knee for the next several days  We will call x-ray results  Follow up: pending xray   I have included other useful information below for your review.  Knee Pain Knee pain can be a result of an injury or other medical conditions. Treatment will depend on the cause of your pain. HOME CARE  Only take medicine as told by your doctor.  Keep a healthy weight. Being overweight can make the knee hurt more.  Stretch before exercising or playing sports.  If there is constant knee pain, change the way you exercise. Ask your doctor for advice.  Make sure shoes fit well. Choose the right shoe for the sport or activity.  Protect your knees. Wear kneepads if needed.  Rest when you are tired. GET HELP RIGHT AWAY IF:   Your knee pain does not stop.  Your knee pain does not get better.  Your knee joint feels hot to the touch.  You have a fever. MAKE SURE YOU:   Understand these instructions.  Will watch this condition.  Will get help right away if you are not doing well or get worse. Document Released: 07/28/2008 Document Revised: 07/24/2011 Document Reviewed: 07/28/2008 [facility] Patient Information 2014 [place], [place].

## 2013-07-18 NOTE — Progress Notes (Signed)
Subjective: Here for left knee pain.   Started in January with limping, by February started having swelling and worse pain.  Has been using some Ibuprofen.   No specific injury or trauma.  He does recall his dog running through house,and dog hit him in knee, but doesn't recall any other injury or trauma.  Now staying swollen all the time.  Denies numbness, tingling, weakness in the leg.  Can't bend down good on that knee.   Going up and down stairs hurt, squatting to get on toilet hurts.   Denies remote injury, no prior surgery of that leg.  No change in exercise in January when symptoms began.   Works in housekeeping, has to bend and squat.  Denies fever, no recent illness.    ROS as in subnjective  Objective: Gen: wd, wn, obese AA male Skin: No obvious erythema, ecchymosis, warmth MSK: Left knee nontender to palpation, slight swelling noticed of knee in general, no laxity, negative Lachman's, McMurray's with mild pain but no popping, flexion and extension at extremes of range of motion with mild pain , otherwise hip and ankle and rest the left leg exam unremarkable  Leg neurovascularly intact  Assessment: Encounter Diagnoses  Name Primary?  . Knee pain, left Yes  . Knee swelling      Plan: We discussed possible causes of his knee symptoms, consider arthritis, DJD, less likely gout, infection?  No specific trauma or mechanism noted.  He is obese, active on the job in housekeeping, bending and squatting.  For now we'll use conservative treatment with ice, rest, over-the-counter Aleve, limited activity on the knee.  We will send him for knee x-ray. Followup pending x-ray

## 2013-08-06 ENCOUNTER — Telehealth: Payer: Self-pay | Admitting: Medical

## 2013-08-06 ENCOUNTER — Ambulatory Visit
Admission: RE | Admit: 2013-08-06 | Discharge: 2013-08-06 | Disposition: A | Discharge status: Home / Self Care | Payer: BC Managed Care – PPO | Source: Ambulatory Visit | Attending: Medical | Admitting: Medical

## 2013-08-06 DIAGNOSIS — M25562 Pain in left knee: Secondary | ICD-10-CM

## 2013-08-06 NOTE — Telephone Encounter (Signed)
Patient states that he is taking (3)  200 mg tablets twice a day. He said he uses the ice when he can. He does have the knee sleeve and he does a lot of walking at work. He try's to elevate it when he can. CLS

## 2013-08-06 NOTE — Telephone Encounter (Signed)
Mild swelling in knee, mild arthritis.   Did he get xray today or did we just get result today?  What is his current symptoms, what is he taking currently?

## 2013-08-06 NOTE — Telephone Encounter (Signed)
Patient states that he did go for the xrays today. He said that it is stiff, he is walking with a limp, swollen and he can't hardly bend his leg. He said the only thing he is taking is Ibuprofen, but it is not helping. He something to get the swelling down. CLS

## 2013-08-06 NOTE — Telephone Encounter (Signed)
I last saw him a few weeks ago.   If he has been using Ibuprofen all this time without improvement, we need ot find another solution.   How much ibuprofen is he using, using ice, knee sleeve, elevation of leg?

## 2013-08-06 NOTE — Telephone Encounter (Signed)
Pt called he went for xrays today and wanted to know how long it would be before he can get the results, informed patient that it does no look like we have the final report yet, it looks like it is waiting for radiologist to read but that I would send a message back and that when we get the results someone would be in contact with him

## 2013-08-07 ENCOUNTER — Other Ambulatory Visit: Payer: Self-pay | Admitting: Medical

## 2013-08-07 MED ORDER — MELOXICAM 15 MG PO TABS: 15.0000 mg | ORAL_TABLET | Freq: Every day | ORAL | Status: DC

## 2013-08-07 NOTE — Telephone Encounter (Signed)
I sent medication for knee arthritis Mobic once daily.   Also: I also want to ask him a question. We partner with a company that does pharmaceutical studies called [employer].  They currently have a knee arthritis study that he would qualify for.  The benefits to him would be free study medication, good support and counseling on arthritis, payment for being in the study.  It wouldn't require a lot of his time.   If he is interested please let me know and fax his info to [employer] for the [employer] OA knee study.  If not interested, then have him f/u in 83mo.

## 2013-08-07 NOTE — Telephone Encounter (Signed)
Spoke with Elenore Rota, he was here 3/6 and had x-ray yesterday, would like to avoid another OV, still having knee discomfort not relieved well with ibuprofen, would like to try something else. Uses [facility] [address]

## 2013-08-07 NOTE — Telephone Encounter (Signed)
Recheck at this time. OV

## 2013-08-07 NOTE — Telephone Encounter (Signed)
LM for PT to CB for OV per Gaynell Face

## 2013-08-08 NOTE — Telephone Encounter (Signed)
PATIENTS MOTHER IS AWARE OF Edward Patterson PAC MESSAGE. CLS

## 2013-10-18 ENCOUNTER — Other Ambulatory Visit: Payer: Self-pay | Admitting: Medical

## 2013-10-20 ENCOUNTER — Other Ambulatory Visit: Payer: Self-pay | Admitting: Medical

## 2013-10-21 NOTE — Telephone Encounter (Signed)
Is this okay to refill? 

## 2013-11-16 ENCOUNTER — Other Ambulatory Visit: Payer: Self-pay | Admitting: Medical

## 2013-12-10 ENCOUNTER — Other Ambulatory Visit: Payer: Self-pay | Admitting: Medical

## 2013-12-10 NOTE — Telephone Encounter (Signed)
Is this okay to refill? 

## 2013-12-16 ENCOUNTER — Other Ambulatory Visit: Payer: Self-pay | Admitting: Medical

## 2013-12-16 NOTE — Telephone Encounter (Signed)
I left the patient a voicemail to call us back to schedule a follow up appt. On his knee. CLS

## 2013-12-16 NOTE — Telephone Encounter (Signed)
Is this okay to refill? 

## 2013-12-16 NOTE — Telephone Encounter (Signed)
rx sent, make f/u appt regarding knee

## 2013-12-19 ENCOUNTER — Ambulatory Visit (INDEPENDENT_AMBULATORY_CARE_PROVIDER_SITE_OTHER): Payer: BC Managed Care – PPO | Admitting: Medical

## 2013-12-19 ENCOUNTER — Encounter: Payer: Self-pay | Admitting: Medical

## 2013-12-19 VITALS — BP 162/118 | HR 80 | Wt 261.0 lb

## 2013-12-19 DIAGNOSIS — G8929 Other chronic pain: Secondary | ICD-10-CM

## 2013-12-19 DIAGNOSIS — M25569 Pain in unspecified knee: Secondary | ICD-10-CM

## 2013-12-19 DIAGNOSIS — I10 Essential (primary) hypertension: Secondary | ICD-10-CM

## 2013-12-19 DIAGNOSIS — E669 Obesity, unspecified: Secondary | ICD-10-CM

## 2013-12-19 DIAGNOSIS — M25562 Pain in left knee: Secondary | ICD-10-CM

## 2013-12-19 MED ORDER — NEBIVOLOL HCL 10 MG PO TABS: 10.0000 mg | ORAL_TABLET | Freq: Every day | ORAL | Status: DC

## 2013-12-19 MED ORDER — CARVEDILOL 12.5 MG PO TABS: 12.5000 mg | ORAL_TABLET | Freq: Two times a day (BID) | ORAL | Status: DC

## 2013-12-19 NOTE — Patient Instructions (Addendum)
  Thank you for giving me the opportunity to serve you today.    Your diagnosis today includes: Encounter Diagnoses  Name Primary?  . Essential hypertension, benign Yes  . Knee pain, chronic, left   . Obesity, unspecified      Specific recommendations today include:  Continue Dyazide daily for blood pressure  Finish out the Bystolic samples then change for a 12.5 mg twice daily  Try and lose weight through healthy diet and exercise  Don't add salt to food  Begin over-the-counter Fish Oil twice daily to help with joint pain  Try to just use the [facility] anti-inflammatory on an as-needed basis  Return soon for physical  Make sure you're using the CPAP every day  Return soon for CPX.

## 2013-12-19 NOTE — Progress Notes (Signed)
Subjective: Here for recheck on left knee pain.   I have seen him twice this year for the pain, and he is using MOBIC most days. It rarely swells now, give some pain every now and then. But no popping no loose joint, no recent injury or fall or trauma  He started having limping in January, by February started having swelling and worse pain.  Had been using some Ibuprofen.   No specific injury or trauma.  He does recall his dog running through house,and dog hit him in knee, but doesn't recall any other injury or trauma.  Now staying swollen all the time.  Denies numbness, tingling, weakness in the leg.  Can't bend down good on that knee.   Going up and down stairs hurt, squatting to get on toilet hurts.   Denies remote injury, no prior surgery of that leg.  No change in exercise in January when symptoms began.   Works in housekeeping, has to bend and squat.  Denies fever, no recent illness.    His Bystolic is too expensive as he has been out of it, continuing to take Dyazide regularly  ROS as in subjective  Objective: Gen: wd, wn, obese AA male Skin: No obvious erythema, ecchymosis, warmth MSK: Left knee nontender, normal range of motion, no laxity, basically normal exam of the knee, otherwise legs unremarkable exam   Assessment: Encounter Diagnoses  Name Primary?  . Essential hypertension, benign Yes  . Knee pain, chronic, left   . Obesity, unspecified      Plan: Hypertension-continue Dyazide, finish out Bystolic samples then change to Corey 12.5 mg twice daily. Work on efforts to lose weight, exercise regular, eat healthy low-fat diet, no added salt  Knee pain - reviewed his x-ray from earlier in the year, change to daily fish oil twice daily, only use Mobic when necessary  Obesity - work on weight loss efforts as discussed

## 2013-12-19 NOTE — Addendum Note (Signed)
Addended by: Leighton Parody F on: 12/19/2013 10:05 AM   Modules accepted: Orders

## 2014-01-13 ENCOUNTER — Other Ambulatory Visit: Payer: Self-pay | Admitting: Medical

## 2014-01-13 NOTE — Telephone Encounter (Signed)
Is this okay to refill? 

## 2014-01-26 ENCOUNTER — Telehealth: Payer: Self-pay | Admitting: Medical

## 2014-01-26 ENCOUNTER — Ambulatory Visit (INDEPENDENT_AMBULATORY_CARE_PROVIDER_SITE_OTHER): Payer: BC Managed Care – PPO | Admitting: Medical

## 2014-01-26 ENCOUNTER — Encounter: Payer: Self-pay | Admitting: Medical

## 2014-01-26 VITALS — BP 142/98 | HR 64 | Temp 97.6°F | Resp 16 | Ht 71.0 in | Wt 266.0 lb

## 2014-01-26 DIAGNOSIS — Z1211 Encounter for screening for malignant neoplasm of colon: Secondary | ICD-10-CM

## 2014-01-26 DIAGNOSIS — G4733 Obstructive sleep apnea (adult) (pediatric): Secondary | ICD-10-CM

## 2014-01-26 DIAGNOSIS — I10 Essential (primary) hypertension: Secondary | ICD-10-CM

## 2014-01-26 DIAGNOSIS — Z23 Encounter for immunization: Secondary | ICD-10-CM

## 2014-01-26 DIAGNOSIS — Z0000 Encounter for general adult medical examination without abnormal findings: Secondary | ICD-10-CM

## 2014-01-26 DIAGNOSIS — E669 Obesity, unspecified: Secondary | ICD-10-CM

## 2014-01-26 LAB — POCT URINALYSIS DIPSTICK
BILIRUBIN UA: NEGATIVE
GLUCOSE UA: NEGATIVE
KETONES UA: NEGATIVE
LEUKOCYTES UA: NEGATIVE
NITRITE UA: NEGATIVE
PH UA: 5
Protein, UA: NEGATIVE
RBC UA: NEGATIVE
Spec Grav, UA: 1.02
Urobilinogen, UA: NEGATIVE

## 2014-01-26 MED ORDER — TRIAMTERENE-HCTZ 37.5-25 MG PO CAPS: 1.0000 | ORAL_CAPSULE | Freq: Every day | ORAL | Status: DC

## 2014-01-26 MED ORDER — CARVEDILOL 12.5 MG PO TABS: 12.5000 mg | ORAL_TABLET | Freq: Two times a day (BID) | ORAL | Status: DC

## 2014-01-26 MED ORDER — LISINOPRIL 20 MG PO TABS: 20.0000 mg | ORAL_TABLET | Freq: Every day | ORAL | Status: DC

## 2014-01-26 NOTE — Telephone Encounter (Signed)
Orders are in EPIC, [facility] will contact the patient for his colonoscopy appt. CLS

## 2014-01-26 NOTE — Patient Instructions (Signed)
Thank you for giving me the opportunity to serve you today.    Your diagnosis today includes: Encounter Diagnoses  Name Primary?  . Routine general medical examination at a health care facility Yes  . Need for prophylactic vaccination and inoculation against influenza   . Obesity, unspecified   . Essential hypertension, benign   . OSA (obstructive sleep apnea)   . Special screening for malignant neoplasms, colon      Specific recommendations today include:  See your eye doctor yearly for routine vision care.  See your dentist yearly for routine dental care including hygiene visits twice yearly.  We updated your influenza vaccine today.     We will refer you for your first screening colonoscopy for colon cancer screening.  Use your CPAP nightly.    Hypertension-  Continue Dyazide blood pressure medication daily (triamterene hydrochlorothiazide)  Finish out [employer], then change to Coreg blood pressure medication  Begin/add on Lisinopril blood pressure medication once daily in the morning  Obesity - please work on more aggressive diet and exercise changes to lose weight   Return pending labs.    I have included other useful information below for your review.   Sleep Apnea  Sleep apnea is a sleep disorder characterized by abnormal pauses in breathing while you sleep. When your breathing pauses, the level of oxygen in your blood decreases. This causes you to move out of deep sleep and into light sleep. As a result, your quality of sleep is poor, and the system that carries your blood throughout your body (cardiovascular system) experiences stress. If sleep apnea remains untreated, the following conditions can develop:  High blood pressure (hypertension).  Coronary artery disease.  Inability to achieve or maintain an erection (impotence).  Impairment of your thought process (cognitive dysfunction). There are three types of sleep apnea: 1. Obstructive sleep  apnea--Pauses in breathing during sleep because of a blocked airway. 2. Central sleep apnea--Pauses in breathing during sleep because the area of the brain that controls your breathing does not send the correct signals to the muscles that control breathing. 3. Mixed sleep apnea--A combination of both obstructive and central sleep apnea. RISK FACTORS The following risk factors can increase your risk of developing sleep apnea:  Being overweight.  Smoking.  Having narrow passages in your nose and throat.  Being of older age.  Being male.  Alcohol use.  Sedative and tranquilizer use.  Ethnicity. Among individuals younger than 35 years, African Americans are at increased risk of sleep apnea. SYMPTOMS   Difficulty staying asleep.  Daytime sleepiness and fatigue.  Loss of energy.  Irritability.  Loud, heavy snoring.  Morning headaches.  Trouble concentrating.  Forgetfulness.  Decreased interest in sex. DIAGNOSIS  In order to diagnose sleep apnea, your caregiver will perform a physical examination. Your caregiver may suggest that you take a home sleep test. Your caregiver may also recommend that you spend the night in a sleep lab. In the sleep lab, several monitors record information about your heart, lungs, and brain while you sleep. Your leg and arm movements and blood oxygen level are also recorded. TREATMENT The following actions may help to resolve mild sleep apnea:  Sleeping on your side.   Using a decongestant if you have nasal congestion.   Avoiding the use of depressants, including alcohol, sedatives, and narcotics.   Losing weight and modifying your diet if you are overweight. There also are devices and treatments to help open your airway:  Oral appliances. These are  custom-made mouthpieces that shift your lower jaw forward and slightly open your bite. This opens your airway.  Devices that create positive airway pressure. This positive pressure "splints"  your airway open to help you breathe better during sleep. The following devices create positive airway pressure:  Continuous positive airway pressure (CPAP) device. The CPAP device creates a continuous level of air pressure with an air pump. The air is delivered to your airway through a mask while you sleep. This continuous pressure keeps your airway open.  Nasal expiratory positive airway pressure (EPAP) device. The EPAP device creates positive air pressure as you exhale. The device consists of single-use valves, which are inserted into each nostril and held in place by adhesive. The valves create very little resistance when you inhale but create much more resistance when you exhale. That increased resistance creates the positive airway pressure. This positive pressure while you exhale keeps your airway open, making it easier to breath when you inhale again.  Bilevel positive airway pressure (BPAP) device. The BPAP device is used mainly in patients with central sleep apnea. This device is similar to the CPAP device because it also uses an air pump to deliver continuous air pressure through a mask. However, with the BPAP machine, the pressure is set at two different levels. The pressure when you exhale is lower than the pressure when you inhale.  Surgery. Typically, surgery is only done if you cannot comply with less invasive treatments or if the less invasive treatments do not improve your condition. Surgery involves removing excess tissue in your airway to create a wider passage way. Document Released: 04/21/2002 Document Revised: 08/26/2012 Document Reviewed: 09/07/2011 [facility] Patient Information 2015 [place], [place]. This information is not intended to replace advice given to you by your health care provider. Make sure you discuss any questions you have with your health care provider.    Hypertension Hypertension, commonly called high blood pressure, is when the force of blood pumping through  your arteries is too strong. Your arteries are the blood vessels that carry blood from your heart throughout your body. A blood pressure reading consists of a higher number over a lower number, such as 110/72. The higher number (systolic) is the pressure inside your arteries when your heart pumps. The lower number (diastolic) is the pressure inside your arteries when your heart relaxes. Ideally you want your blood pressure below 120/80. Hypertension forces your heart to work harder to pump blood. Your arteries may become narrow or stiff. Having hypertension puts you at risk for heart disease, stroke, and other problems.  RISK FACTORS Some risk factors for high blood pressure are controllable. Others are not.  Risk factors you cannot control include:   Race. You may be at higher risk if you are African American.  Age. Risk increases with age.  Gender. Men are at higher risk than women before age 75 years. After age 66, women are at higher risk than men. Risk factors you can control include:  Not getting enough exercise or physical activity.  Being overweight.  Getting too much fat, sugar, calories, or salt in your diet.  Drinking too much alcohol. SIGNS AND SYMPTOMS Hypertension does not usually cause signs or symptoms. Extremely high blood pressure (hypertensive crisis) may cause headache, anxiety, shortness of breath, and nosebleed. DIAGNOSIS  To check if you have hypertension, your health care provider will measure your blood pressure while you are seated, with your arm held at the level of your heart. It should be measured  at least twice using the same arm. Certain conditions can cause a difference in blood pressure between your right and left arms. A blood pressure reading that is higher than normal on one occasion does not mean that you need treatment. If one blood pressure reading is high, ask your health care provider about having it checked again. TREATMENT  Treating high blood  pressure includes making lifestyle changes and possibly taking medicine. Living a healthy lifestyle can help lower high blood pressure. You may need to change some of your habits. Lifestyle changes may include:  Following the DASH diet. This diet is high in fruits, vegetables, and whole grains. It is low in salt, red meat, and added sugars.  Getting at least 2 hours of brisk physical activity every week.  Losing weight if necessary.  Not smoking.  Limiting alcoholic beverages.  Learning ways to reduce stress. If lifestyle changes are not enough to get your blood pressure under control, your health care provider may prescribe medicine. You may need to take more than one. Work closely with your health care provider to understand the risks and benefits. HOME CARE INSTRUCTIONS  Have your blood pressure rechecked as directed by your health care provider.   Take medicines only as directed by your health care provider. Follow the directions carefully. Blood pressure medicines must be taken as prescribed. The medicine does not work as well when you skip doses. Skipping doses also puts you at risk for problems.   Do not smoke.   Monitor your blood pressure at home as directed by your health care provider. SEEK MEDICAL CARE IF:   You think you are having a reaction to medicines taken.  You have recurrent headaches or feel dizzy.  You have swelling in your ankles.  You have trouble with your vision. SEEK IMMEDIATE MEDICAL CARE IF:  You develop a severe headache or confusion.  You have unusual weakness, numbness, or feel faint.  You have severe chest or abdominal pain.  You vomit repeatedly.  You have trouble breathing. MAKE SURE YOU:   Understand these instructions.  Will watch your condition.  Will get help right away if you are not doing well or get worse. Document Released: 05/01/2005 Document Revised: 09/15/2013 Document Reviewed: 02/21/2013 [facility] Patient  Information 2015 [place], [place]. This information is not intended to replace advice given to you by your health care provider. Make sure you discuss any questions you have with your health care provider.

## 2014-01-26 NOTE — Progress Notes (Signed)
Subjective:   HPI  Edward Patterson is a 48 y.o. male who presents for a complete physical.   Preventative care: Last ophthalmology visit:yes [place] Last dental visit:yes Dr. Leonel Ramsay Last colonoscopy:n/a Last prostate exam: yes Last QIW:[id number] Last labs: last year  Prior vaccinations: TD or Tdap: 2014 Influenza:01/26/14 Pneumococcal:N/a Shingles/Zostavax:n/a  Advanced directive:n/a Health care power of attorney:n/a Living will:n/a  Concerns: Hypertension-taking Dyazide and still finishing his supply of Bystolic.  Hasn't started Coreg yet.  Obesity- gets some exercise, but realizes he hasn't had any major improvement in weight.  Sleep apnea-compliant with CPAP most of the time.  Reviewed their medical, surgical, family, social, medication, and allergy history and updated chart as appropriate.  Past Medical History  Diagnosis Date  . Hypertension   . Obstructive sleep apnea on CPAP     CPAP  . Allergy   . Obesity     Past Surgical History  Procedure Laterality Date  . Wisdom tooth extraction      History   Social History  . Marital Status: Single    Spouse Name: N/A    Number of Children: N/A  . Years of Education: N/A   Occupational History  . Not on file.   Social History Main Topics  . Smoking status: Never Smoker   . Smokeless tobacco: Never Used  . Alcohol Use: 4.8 oz/week    5 Cans of beer, 3 Shots of liquor per week  . Drug Use: No  . Sexual Activity: Not on file     Comment: single, engaged, 1 child, exercise 3-4 days/wk, weights, walks a lot at work, works in housekeeping at [employer]; prior surgical tech experience   Other Topics Concern  . Not on file   Social History Narrative   Married, 59yo child, exercise - working 2 jobs, physically active on the job.   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 Sometimes uses the gym at [place] History  Problem Relation Age of Onset  . Hypertension Mother   .  Diabetes Maternal Aunt   . Cancer Neg Hx   . Heart disease Neg Hx   . Stroke Neg Hx   . Hyperlipidemia Neg Hx     Current outpatient prescriptions:aspirin 81 MG tablet, Take 81 mg by mouth daily., Disp: , Rfl: ;  carvedilol (COREG) 12.5 MG tablet, Take 1 tablet (12.5 mg total) by mouth 2 (two) times daily with a meal., Disp: 60 tablet, Rfl: 1;  nebivolol (BYSTOLIC) 10 MG tablet, Take 1 tablet (10 mg total) by mouth daily. Samples given, Disp: 56 tablet, Rfl: 0 triamterene-hydrochlorothiazide (DYAZIDE) 37.5-25 MG per capsule, TAKE ONE CAPSULE BY MOUTH DAILY, Disp: 30 capsule, Rfl: 2  No Known Allergies   Review of Systems Constitutional: -fever, -chills, -sweats, -unexpected weight change, -decreased appetite, -fatigue Allergy: -sneezing, -itching, -congestion Dermatology: -changing moles, --rash, -lumps ENT: -runny nose, -ear pain, -sore throat, -hoarseness, -sinus pain, -teeth pain, - ringing in ears, -hearing loss, -nosebleeds Cardiology: -chest pain, -palpitations, -swelling, -difficulty breathing when lying flat, -waking up short of breath Respiratory: -cough, -shortness of breath, -difficulty breathing with exercise or exertion, -wheezing, -coughing up blood Gastroenterology: -abdominal pain, -nausea, -vomiting, -diarrhea, -constipation, -blood in stool, -changes in bowel movement, -difficulty swallowing or eating Hematology: -bleeding, -bruising  Musculoskeletal: -joint aches, -muscle aches, -joint swelling, -back pain, -neck pain, -cramping, -changes in gait Ophthalmology: denies vision changes, eye redness, itching, discharge Urology: -burning with urination, -difficulty urinating, -blood in urine, -urinary frequency, -urgency, -  incontinence Neurology: -headache, -weakness, -tingling, -numbness, -memory loss, -falls, -dizziness Psychology: -depressed mood, -agitation, -sleep problems     Objective:   Physical Exam  BP 142/98  Pulse 64  Temp(Src) 97.6 F (36.4 C) (Oral)   Resp 16  Ht 5\' 11"  (1.803 m)  Wt 266 lb (120.657 kg)  BMI 37.12 kg/m2  General appearance: alert, no distress, WD/WN, obese AA male  Skin: scattered pedunculated brown well defined lesions of bilat orbits and neck and upper back, suggestive of skin tags, left lateral upper arm and forearm with 2 patches of rectangular scar, central chest with horizontal 2cm scar/keloid, right parietal/frontal scalp with 3cm linear scar/keloid slightly raised HEENT: normocephalic, conjunctiva/corneas normal, sclerae anicteric, PERRLA, EOMi, nares patent, no discharge or erythema, pharynx normal  Oral cavity: MMM, tongue normal, teeth in good repair but some gingival inflammation Neck: supple, no lymphadenopathy, no thyromegaly, no masses, normal ROM, no bruits  Chest: non tender, normal shape and expansion  Heart: RRR, normal S1, S2, no murmurs  Lungs: CTA bilaterally, no wheezes, rhonchi, or rales  Abdomen: +bs, soft, non tender, non distended, no masses, no hepatomegaly, no splenomegaly, no bruits  Back: non tender, normal ROM, no scoliosis  Musculoskeletal: upper extremities non tender, no obvious deformity, normal ROM throughout, lower extremities non tender, no obvious deformity, normal ROM throughout  Extremities: no edema, no cyanosis, no clubbing  Pulses: 2+ symmetric, upper and lower extremities, normal cap refill  Neurological: alert, oriented x 3, CN2-12 intact, strength normal upper extremities and lower extremities, sensation normal throughout, DTRs 2+ throughout, no cerebellar signs, gait normal  Psychiatric: normal affect, behavior normal, pleasant  GU: normal male external genitalia, circumcised, nontender, no masses, no hernia, no lymphadenopathy  Rectal: deferred   Adult ECG Report  Indication: hypertension  Rate: 67bpm  Rhythm: normal sinus rhythm  QRS Axis: 61 degrees  PR Interval: 152ms  QRS Duration: 58ms  QTc: 4105ms  Conduction Disturbances: none  Other Abnormalities: meets  voltage criteria for LVH  Patient's cardiac risk factors are: family history of premature cardiovascular disease, hypertension, male gender and obesity (BMI >= 30 kg/m2).  EKG comparison: 2014   Narrative Interpretation: no acute changes, possible LVH   Assessment and Plan :     Encounter Diagnoses  Name Primary?  . Routine general medical examination at a health care facility Yes  . Need for prophylactic vaccination and inoculation against influenza   . Obesity, unspecified   . Essential hypertension, benign   . OSA (obstructive sleep apnea)   . Special screening for malignant neoplasms, colon    Physical exam - discussed healthy lifestyle, diet, exercise, preventative care, vaccinations, and addressed their concerns.   Counseled on the influenza virus vaccine.  Vaccine information sheet given.  Influenza vaccine given after consent obtained. See your eye doctor yearly for routine vision care. See your dentist yearly for routine dental care including hygiene visits twice yearly. HTN - c/t Dyazide, finish Bystolic then change to Coreg.  Add Lisinopril daily.  We discussed getting the blood pressure readings to me in one month.  He will check insurance co-pay to have an echocardiogram as well Obesity - discussed need to do better at weight loss through healthy diet and routine exercise. OSA - encouraged him to be good at using CPAP every day.  Discussed risks of OSA Will refer for first screening colonoscopy Follow-up pending labs

## 2014-01-26 NOTE — Telephone Encounter (Signed)
Refer for screening colonoscopy 

## 2014-01-27 LAB — COMPREHENSIVE METABOLIC PANEL
ALBUMIN: 4.5 g/dL (ref 3.5–5.2)
ALK PHOS: 69 U/L (ref 39–117)
ALT: 31 U/L (ref 0–53)
AST: 24 U/L (ref 0–37)
BILIRUBIN TOTAL: 0.6 mg/dL (ref 0.2–1.2)
BUN: 15 mg/dL (ref 6–23)
CO2: 24 meq/L (ref 19–32)
Calcium: 9 mg/dL (ref 8.4–10.5)
Chloride: 98 mEq/L (ref 96–112)
Creat: 1.08 mg/dL (ref 0.50–1.35)
Glucose, Bld: 96 mg/dL (ref 70–99)
POTASSIUM: 4.2 meq/L (ref 3.5–5.3)
SODIUM: 138 meq/L (ref 135–145)
TOTAL PROTEIN: 6.9 g/dL (ref 6.0–8.3)

## 2014-01-27 LAB — CBC
HCT: 48.7 % (ref 39.0–52.0)
Hemoglobin: 16 g/dL (ref 13.0–17.0)
MCH: 28.2 pg (ref 26.0–34.0)
MCHC: 32.9 g/dL (ref 30.0–36.0)
MCV: 85.9 fL (ref 78.0–100.0)
PLATELETS: 216 10*3/uL (ref 150–400)
RBC: 5.67 MIL/uL (ref 4.22–5.81)
RDW: 14.9 % (ref 11.5–15.5)
WBC: 4 10*3/uL (ref 4.0–10.5)

## 2014-01-27 LAB — HIGH SENSITIVITY CRP: CRP HIGH SENSITIVITY: 1.4 mg/L

## 2014-01-27 LAB — LIPID PANEL
CHOLESTEROL: 173 mg/dL (ref 0–200)
HDL: 38 mg/dL — ABNORMAL LOW (ref >39)
LDL Cholesterol: 83 mg/dL (ref 0–99)
Total CHOL/HDL Ratio: 4.6 Ratio
Triglycerides: 259 mg/dL — ABNORMAL HIGH (ref <150)
VLDL: 52 mg/dL — ABNORMAL HIGH (ref 0–40)

## 2014-01-27 LAB — HEMOGLOBIN A1C
Hgb A1c MFr Bld: 5.9 % — ABNORMAL HIGH (ref <5.7)
MEAN PLASMA GLUCOSE: 123 mg/dL — AB (ref <117)

## 2014-01-29 ENCOUNTER — Encounter: Payer: Self-pay | Admitting: Medical

## 2014-01-29 ENCOUNTER — Encounter: Payer: Self-pay | Admitting: Gastroenterology

## 2014-03-13 ENCOUNTER — Ambulatory Visit (AMBULATORY_SURGERY_CENTER): Payer: Self-pay | Admitting: *Deleted

## 2014-03-13 VITALS — Ht 70.0 in | Wt 265.2 lb

## 2014-03-13 DIAGNOSIS — Z1211 Encounter for screening for malignant neoplasm of colon: Secondary | ICD-10-CM

## 2014-03-13 MED ORDER — NA SULFATE-K SULFATE-MG SULF 17.5-3.13-1.6 GM/177ML PO SOLN: 1.0000 | Freq: Once | ORAL | Status: DC

## 2014-03-13 NOTE — Progress Notes (Signed)
Denies allergies to eggs or soy products. Denies complications with sedation or anesthesia. Denies O2 use. Denies use of diet or weight loss medications.  Emmi instructions given for colonoscopy.  

## 2014-03-23 ENCOUNTER — Encounter: Payer: BC Managed Care – PPO | Admitting: Gastroenterology

## 2014-06-03 ENCOUNTER — Encounter: Payer: Self-pay | Admitting: Gastroenterology

## 2014-06-03 ENCOUNTER — Ambulatory Visit (AMBULATORY_SURGERY_CENTER): Payer: BC Managed Care – PPO | Admitting: Gastroenterology

## 2014-06-03 VITALS — BP 145/88 | HR 79 | Temp 97.3°F | Resp 25 | Ht 70.0 in | Wt 265.0 lb

## 2014-06-03 DIAGNOSIS — D123 Benign neoplasm of transverse colon: Secondary | ICD-10-CM

## 2014-06-03 DIAGNOSIS — D124 Benign neoplasm of descending colon: Secondary | ICD-10-CM

## 2014-06-03 DIAGNOSIS — K5730 Diverticulosis of large intestine without perforation or abscess without bleeding: Secondary | ICD-10-CM

## 2014-06-03 DIAGNOSIS — Z1211 Encounter for screening for malignant neoplasm of colon: Secondary | ICD-10-CM

## 2014-06-03 DIAGNOSIS — D125 Benign neoplasm of sigmoid colon: Secondary | ICD-10-CM

## 2014-06-03 MED ORDER — SODIUM CHLORIDE 0.9 % IV SOLN: 500.0000 mL | INTRAVENOUS | Status: DC

## 2014-06-03 NOTE — Op Note (Addendum)
[place]  [address] [place], [address]   COLONOSCOPY PROCEDURE REPORT  PATIENT: Edward Patterson, Edward Patterson  MR#: [id number] BIRTHDATE: 09-Apr-1966 , 48  yrs. old GENDER: male ENDOSCOPIST: Inda Castle, MD REFERRED AS:[facility] [employer], M.D. PROCEDURE DATE:  06/03/2014 PROCEDURE:   Colonoscopy with snare polypectomy, Colonoscopy with biopsy, and Submucosal injection, any substance First Screening Colonoscopy - Avg.  risk and is 50 yrs.  old or older Yes.  Prior Negative Screening - Now for repeat screening. N/A  History of Adenoma - Now for follow-up colonoscopy & has been > or = to 3 yrs.  N/A  Polyps Removed Today? Yes. ASA CLASS:   Class II INDICATIONS:first colonoscopy and average risk for colon cancer. MEDICATIONS: Monitored anesthesia care and Propofol 650 mg IV  DESCRIPTION OF PROCEDURE:   After the risks benefits and alternatives of the procedure were thoroughly explained, informed consent was obtained.  The digital rectal exam revealed no abnormalities of the rectum.   The LB [id number]  endoscope was introduced through the anus and advanced to the cecum, which was identified by both the appendix and ileocecal valve. No adverse events experienced.   The quality of the prep was Suprep good  The instrument was then slowly withdrawn as the colon was fully examined.      COLON FINDINGS: There was mild diverticulosis noted in the ascending colon.   A sessile polyp measuring 12 mm in size was found in the ascending colon. Cause of the large polyp in the hepatic flexure carrying the possibility of a malignancy, the ascending colon polyp was not removed.  A single polyp measuring 3 cm in size was found at the hepatic flexure.  Multiple biopsies were performed using cold forceps.  A tattoo was applied.   Two sessile polyps measuring 5 mm in size were found in the transverse colon.  A polypectomy was performed with a cold snare.  The resection was complete,  the polyp tissue was completely retrieved and sent to histology.   A semi-pedunculated polyp measuring 20 mm in size was found in the descending colon.  A polypectomy was performed using snare cautery. The resection was complete, the polyp tissue was completely retrieved and sent to histology.   A sessile polyp measuring 4 mm in size was found in the descending colon.  A polypectomy was performed with a cold snare.  The resection was complete but the polyp tissue was not retrieved.   Two sessile polyps ranging from 2 to 36mm in size were found in the descending colon and sigmoid colon.  A polypectomy was performed with a cold snare.  The resection was complete, the polyp tissue was completely retrieved and sent to histology.   There was mild diverticulosis noted in the sigmoid colon.  Retroflexed views revealed no abnormalities. The time to cecum=3 minutes 10 seconds.  Withdrawal time=27 minutes 06 seconds.  The scope was withdrawn and the procedure completed. COMPLICATIONS: There were no immediate complications.  ENDOSCOPIC IMPRESSION: 1. colonic polyposis 2.  Diverticulosis  RECOMMENDATIONS: Await biopsy results  eSigned:  Inda Castle, MD 06/03/2014 12:37 PM Revised: 06/03/2014 12:37 PM  cc:   PATIENT NAME:  [name], [name] MR#: [id number]

## 2014-06-03 NOTE — Progress Notes (Signed)
Called to room to assist during endoscopic procedure.  Patient ID and intended procedure confirmed with present staff. Received instructions for my participation in the procedure from the performing physician.  

## 2014-06-03 NOTE — Patient Instructions (Addendum)
YOU HAD AN ENDOSCOPIC PROCEDURE TODAY AT THE [facility]: Refer to the procedure report that was given to you for any specific questions about what was found during the examination.  If the procedure report does not answer your questions, please call your gastroenterologist to clarify.  If you requested that your care partner not be given the details of your procedure findings, then the procedure report has been included in a sealed envelope for you to review at your convenience later.  YOU SHOULD EXPECT: Some feelings of bloating in the abdomen. Passage of more gas than usual.  Walking can help get rid of the air that was put into your GI tract during the procedure and reduce the bloating. If you had a lower endoscopy (such as a colonoscopy or flexible sigmoidoscopy) you may notice spotting of blood in your stool or on the toilet paper. If you underwent a bowel prep for your procedure, then you may not have a normal bowel movement for a few days.  DIET: Your first meal following the procedure should be a light meal and then it is ok to progress to your normal diet.  A half-sandwich or bowl of soup is an example of a good first meal.  Heavy or fried foods are harder to digest and may make you feel nauseous or bloated.  Likewise meals heavy in dairy and vegetables can cause extra gas to form and this can also increase the bloating.  Drink plenty of fluids but you should avoid alcoholic beverages for 24 hours.  ACTIVITY: Your care partner should take you home directly after the procedure.  You should plan to take it easy, moving slowly for the rest of the day.  You can resume normal activity the day after the procedure however you should NOT DRIVE or use heavy machinery for 24 hours (because of the sedation medicines used during the test).    SYMPTOMS TO REPORT IMMEDIATELY: A gastroenterologist can be reached at any hour.  During normal business hours, 8:30 AM to 5:00 PM Monday through Friday,  call [phone].  After hours and on weekends, please call the GI answering service at [phone] who will take a message and have the physician on call contact you.   Following lower endoscopy (colonoscopy or flexible sigmoidoscopy):  Excessive amounts of blood in the stool  Significant tenderness or worsening of abdominal pains  Swelling of the abdomen that is new, acute  Fever of 100F or higher    FOLLOW UP: If any biopsies were taken you will be contacted by phone or by letter within the next 1-3 weeks.  Call your gastroenterologist if you have not heard about the biopsies in 3 weeks.  Our staff will call the home number listed on your records the next business day following your procedure to check on you and address any questions or concerns that you may have at that time regarding the information given to you following your procedure. This is a courtesy call and so if there is no answer at the home number and we have not heard from you through the emergency physician on call, we will assume that you have returned to your regular daily activities without incident.  SIGNATURES/CONFIDENTIALITY: You and/or your care partner have signed paperwork which will be entered into your electronic medical record.  These signatures attest to the fact that that the information above on your After Visit Summary has been reviewed and is understood.  Full responsibility of the confidentiality   of this discharge information lies with you and/or your care-partner.   Information on polyps & diverticulosis given to you today  Await pathology report

## 2014-06-03 NOTE — Progress Notes (Signed)
Pathology was sent stat per Dr. Deatra Ina.

## 2014-06-03 NOTE — Progress Notes (Signed)
A/ox3 pleased with MAC, report to Jane RN 

## 2014-06-04 ENCOUNTER — Telehealth: Payer: Self-pay | Admitting: *Deleted

## 2014-06-04 ENCOUNTER — Telehealth: Payer: Self-pay

## 2014-06-04 NOTE — Telephone Encounter (Signed)
No answer, left message to call if questions or concerns. 

## 2014-06-04 NOTE — Telephone Encounter (Signed)
Patient agrees to an appointment 06/11/14 at 9:30 am to discuss.

## 2014-06-04 NOTE — Telephone Encounter (Signed)
-----   Message from Inda Castle, MD sent at 06/04/2014 10:43 AM EST ----- Edward Patterson, Please inform the patient that the biopsies from yesterday did not show any cancer.  He needs an office visit WITH ME to discuss treatment options.

## 2014-06-06 ENCOUNTER — Other Ambulatory Visit: Payer: Self-pay | Admitting: Medical

## 2014-06-08 NOTE — Telephone Encounter (Signed)
Is this okay to refill? 

## 2014-06-11 ENCOUNTER — Encounter: Payer: Self-pay | Admitting: Gastroenterology

## 2014-06-11 ENCOUNTER — Ambulatory Visit: Payer: BC Managed Care – PPO | Admitting: Gastroenterology

## 2014-06-11 ENCOUNTER — Ambulatory Visit (INDEPENDENT_AMBULATORY_CARE_PROVIDER_SITE_OTHER): Payer: BC Managed Care – PPO | Admitting: Gastroenterology

## 2014-06-11 VITALS — BP 140/90 | HR 95 | Ht 70.0 in | Wt 267.0 lb

## 2014-06-11 DIAGNOSIS — D126 Benign neoplasm of colon, unspecified: Secondary | ICD-10-CM

## 2014-06-11 NOTE — Assessment & Plan Note (Signed)
The patient has  polyps which, at this point, appear benign.  The largest polyp in the hepatic flexure so far does not demonstrate any malignancy or dysplasia.  Methods of polyp removal including surgery and endoscopic mucosal resection were discussed.  The higher risk for bleeding and perforation with colonoscopy and EMR were discussed in detail.  The patient wishes to proceed with colonoscopy and not to go directly to surgery.  Currently, it will be scheduled.

## 2014-06-11 NOTE — Patient Instructions (Signed)
Your hospital colonoscopy has been scheduled on 07/13/2014 at 12:45pm Separate instructions have been given  Suprep kit given

## 2014-06-11 NOTE — Progress Notes (Signed)
_                                                                                                                History of Present Illness:  Edward Patterson is a pleasant 49 year old Afro-American male here following colonoscopy to discuss polypectomy.  Several large polyps were seen at colonoscopy.  He largest polyp was biopsied only because of its size.  No dysplasia or malignancy elsewhere seen.  Removal by colonoscopy carries a high risk of bleeding and perforation in these issues were discussed with the patient.   Past Medical History  Diagnosis Date  . Hypertension   . Obstructive sleep apnea on CPAP     CPAP  . Allergy   . Obesity    Past Surgical History  Procedure Laterality Date  . Wisdom tooth extraction     family history includes Diabetes in his maternal aunt; Hypertension in his mother. There is no history of Cancer, Heart disease, Stroke, Hyperlipidemia, Colon cancer, Esophageal cancer, Rectal cancer, or Stomach cancer. Current Outpatient Prescriptions  Medication Sig Dispense Refill  . aspirin 81 MG tablet Take 81 mg by mouth daily.    . carvedilol (COREG) 12.5 MG tablet Take 1 tablet (12.5 mg total) by mouth 2 (two) times daily with a meal. 60 tablet 5  . lisinopril (PRINIVIL,ZESTRIL) 20 MG tablet Take 1 tablet (20 mg total) by mouth daily. 30 tablet 5  . Multiple Vitamin (MULTIVITAMIN) capsule Take 1 capsule by mouth daily.    . Omega-3 Fatty Acids (FISH OIL PO) Take by mouth.    . triamterene-hydrochlorothiazide (DYAZIDE) 37.5-25 MG per capsule Take 1 each (1 capsule total) by mouth daily. 90 capsule 3   No current facility-administered medications for this visit.   Allergies as of 06/11/2014  . (No Known Allergies)    reports that he has never smoked. He has never used smokeless tobacco. He reports that he drinks about 4.8 oz of alcohol per week. He reports that he does not use illicit drugs.   Review of Systems: Pertinent positive and negative  review of systems were noted in the above HPI section. All other review of systems were otherwise negative.  Vital signs were reviewed in today's medical record Physical Exam: General: Well developed , well nourished, no acute distress Skin: anicteric Head: Normocephalic and atraumatic Eyes:  sclerae anicteric, EOMI Ears: Normal auditory acuity Mouth: No deformity or lesions Neck: Supple, no masses or thyromegaly Lungs: Clear throughout to auscultation Heart: Regular rate and rhythm; no murmurs, rubs or bruits Abdomen: Soft, non tender and non distended. No masses, hepatosplenomegaly or hernias noted. Normal Bowel sounds Rectal:deferred Musculoskeletal: Symmetrical with no gross deformities  Skin: No lesions on visible extremities Pulses:  Normal pulses noted Extremities: No clubbing, cyanosis, edema or deformities noted Neurological: Alert oriented x 4, grossly nonfocal Cervical Nodes:  No significant cervical adenopathy Inguinal Nodes: No significant inguinal adenopathy Psychological:  Alert and cooperative. Normal mood and affect  See Assessment and Plan under Problem List

## 2014-07-03 ENCOUNTER — Encounter (HOSPITAL_COMMUNITY): Payer: Self-pay | Admitting: *Deleted

## 2014-07-13 ENCOUNTER — Ambulatory Visit (HOSPITAL_COMMUNITY)
Admission: RE | Admit: 2014-07-13 | Discharge: 2014-07-13 | Disposition: A | Discharge status: Home / Self Care | Payer: BC Managed Care – PPO | Source: Ambulatory Visit | Attending: Gastroenterology | Admitting: Gastroenterology

## 2014-07-13 ENCOUNTER — Ambulatory Visit (HOSPITAL_COMMUNITY): Payer: BC Managed Care – PPO | Admitting: Anesthesiology

## 2014-07-13 ENCOUNTER — Encounter (HOSPITAL_COMMUNITY): Payer: Self-pay | Admitting: *Deleted

## 2014-07-13 ENCOUNTER — Telehealth: Payer: Self-pay

## 2014-07-13 ENCOUNTER — Encounter (HOSPITAL_COMMUNITY)
Admission: RE | Disposition: A | Discharge status: Home / Self Care | Payer: Self-pay | Source: Ambulatory Visit | Attending: Gastroenterology

## 2014-07-13 DIAGNOSIS — Z6838 Body mass index (BMI) 38.0-38.9, adult: Secondary | ICD-10-CM | POA: Insufficient documentation

## 2014-07-13 DIAGNOSIS — I10 Essential (primary) hypertension: Secondary | ICD-10-CM | POA: Insufficient documentation

## 2014-07-13 DIAGNOSIS — K5730 Diverticulosis of large intestine without perforation or abscess without bleeding: Secondary | ICD-10-CM | POA: Insufficient documentation

## 2014-07-13 DIAGNOSIS — Z9989 Dependence on other enabling machines and devices: Secondary | ICD-10-CM | POA: Insufficient documentation

## 2014-07-13 DIAGNOSIS — C182 Malignant neoplasm of ascending colon: Secondary | ICD-10-CM | POA: Diagnosis not present

## 2014-07-13 DIAGNOSIS — G4733 Obstructive sleep apnea (adult) (pediatric): Secondary | ICD-10-CM | POA: Diagnosis not present

## 2014-07-13 DIAGNOSIS — D126 Benign neoplasm of colon, unspecified: Secondary | ICD-10-CM

## 2014-07-13 DIAGNOSIS — Z79899 Other long term (current) drug therapy: Secondary | ICD-10-CM | POA: Diagnosis not present

## 2014-07-13 DIAGNOSIS — K635 Polyp of colon: Secondary | ICD-10-CM | POA: Diagnosis present

## 2014-07-13 HISTORY — PX: HOT HEMOSTASIS: SHX5433

## 2014-07-13 HISTORY — PX: COLONOSCOPY WITH PROPOFOL: SHX5780

## 2014-07-13 SURGERY — COLONOSCOPY WITH PROPOFOL
Anesthesia: Monitor Anesthesia Care

## 2014-07-13 MED ORDER — PROPOFOL 10 MG/ML IV BOLUS
INTRAVENOUS | Status: AC
  Filled 2014-07-13: qty 20

## 2014-07-13 MED ORDER — PROPOFOL INFUSION 10 MG/ML OPTIME
INTRAVENOUS | Status: DC | PRN
  Administered 2014-07-13: 140 ug/kg/min via INTRAVENOUS

## 2014-07-13 MED ORDER — PROPOFOL 10 MG/ML IV BOLUS
INTRAVENOUS | Status: AC
Start: 2014-07-13 — End: 2014-07-13
  Filled 2014-07-13: qty 20

## 2014-07-13 MED ORDER — SODIUM CHLORIDE 0.9 % IV SOLN: INTRAVENOUS | Status: DC

## 2014-07-13 MED ORDER — LACTATED RINGERS IV SOLN: INTRAVENOUS | Status: DC

## 2014-07-13 MED ORDER — LIDOCAINE HCL (CARDIAC) 20 MG/ML IV SOLN
INTRAVENOUS | Status: AC
  Filled 2014-07-13: qty 5

## 2014-07-13 MED ORDER — METOPROLOL TARTRATE 1 MG/ML IV SOLN
INTRAVENOUS | Status: DC | PRN
  Administered 2014-07-13 (×4): 1 mg via INTRAVENOUS

## 2014-07-13 MED ORDER — LACTATED RINGERS IV SOLN
INTRAVENOUS | Status: DC
  Administered 2014-07-13: 1000 mL via INTRAVENOUS

## 2014-07-13 MED ORDER — EPINEPHRINE HCL 0.1 MG/ML IJ SOSY
PREFILLED_SYRINGE | INTRAMUSCULAR | Status: AC
  Filled 2014-07-13: qty 10

## 2014-07-13 MED ORDER — METHYLENE BLUE 1 % INJ SOLN
INTRAMUSCULAR | Status: DC | PRN
  Administered 2014-07-13: 24 mL via SUBMUCOSAL

## 2014-07-13 MED ORDER — METHYLENE BLUE 1 % INJ SOLN
INTRAMUSCULAR | Status: AC
  Filled 2014-07-13: qty 10

## 2014-07-13 MED ORDER — LIDOCAINE HCL (CARDIAC) 20 MG/ML IV SOLN
INTRAVENOUS | Status: DC | PRN
  Administered 2014-07-13: 100 mg via INTRAVENOUS

## 2014-07-13 SURGICAL SUPPLY — 21 items

## 2014-07-13 NOTE — Transfer of Care (Signed)
Immediate Anesthesia Transfer of Care Note  Patient: Edward Patterson  Procedure(s) Performed: Procedure(s) (LRB): COLONOSCOPY WITH PROPOFOL (N/A) HOT HEMOSTASIS (ARGON PLASMA COAGULATION/BICAP) (N/A)  Patient Location: PACU  Anesthesia Type: MAC  Level of Consciousness: sedated, patient cooperative and responds to stimulation  Airway & Oxygen Therapy: Patient Spontanous Breathing and Patient connected to face mask oxgen  Post-op Assessment: Report given to PACU RN and Post -op Vital signs reviewed and stable  Post vital signs: Reviewed and stable  Complications: No apparent anesthesia complications

## 2014-07-13 NOTE — H&P (Signed)
  History of Present Illness: Mr. Candelas is a pleasant 49 year old Afro-American male here following colonoscopy to discuss polypectomy. Several large polyps were seen at colonoscopy. He largest polyp was biopsied only because of its size. No dysplasia or malignancy elsewhere seen. Removal by colonoscopy carries a high risk of bleeding and perforation in these issues were discussed with the patient.   Past Medical History  Diagnosis Date  . Hypertension   . Obstructive sleep apnea on CPAP     CPAP  . Allergy   . Obesity    Past Surgical History  Procedure Laterality Date  . Wisdom tooth extraction     family history includes Diabetes in his maternal aunt; Hypertension in his mother. There is no history of Cancer, Heart disease, Stroke, Hyperlipidemia, Colon cancer, Esophageal cancer, Rectal cancer, or Stomach cancer. Current Outpatient Prescriptions  Medication Sig Dispense Refill  . aspirin 81 MG tablet Take 81 mg by mouth daily.    . carvedilol (COREG) 12.5 MG tablet Take 1 tablet (12.5 mg total) by mouth 2 (two) times daily with a meal. 60 tablet 5  . lisinopril (PRINIVIL,ZESTRIL) 20 MG tablet Take 1 tablet (20 mg total) by mouth daily. 30 tablet 5  . Multiple Vitamin (MULTIVITAMIN) capsule Take 1 capsule by mouth daily.    . Omega-3 Fatty Acids (FISH OIL PO) Take by mouth.    . triamterene-hydrochlorothiazide (DYAZIDE) 37.5-25 MG per capsule Take 1 each (1 capsule total) by mouth daily. 90 capsule 3   No current facility-administered medications for this visit.   Allergies as of 06/11/2014  . (No Known Allergies)    reports that he has never smoked. He has never used smokeless tobacco. He reports that he drinks about 4.8 oz of alcohol per week. He reports that he does not use illicit drugs.   Review of Systems: Pertinent positive and negative review of systems were noted in the above HPI section. All  other review of systems were otherwise negative.  Vital signs were reviewed in today's medical record Physical Exam: General: Well developed , well nourished, no acute distress Skin: anicteric Head: Normocephalic and atraumatic Eyes: sclerae anicteric, EOMI Ears: Normal auditory acuity Mouth: No deformity or lesions Neck: Supple, no masses or thyromegaly Lungs: Clear throughout to auscultation Heart: Regular rate and rhythm; no murmurs, rubs or bruits Abdomen: Soft, non tender and non distended. No masses, hepatosplenomegaly or hernias noted. Normal Bowel sounds Rectal:deferred Musculoskeletal: Symmetrical with no gross deformities  Skin: No lesions on visible extremities Pulses: Normal pulses noted Extremities: No clubbing, cyanosis, edema or deformities noted Neurological: Alert oriented x 4, grossly nonfocal Cervical Nodes: No significant cervical adenopathy Inguinal Nodes: No significant inguinal adenopathy Psychological: Alert and cooperative. Normal mood and affect                      Status: Written Related Problem: Benign neoplasm of colon   Expand All Collapse All   The patient has polyps which, at this point, appear benign. The largest polyp in the hepatic flexure so far does not demonstrate any malignancy or dysplasia. Methods of polyp removal including surgery and endoscopic mucosal resection were discussed. The higher risk for bleeding and perforation with colonoscopy and EMR were discussed in detail. The patient wishes to proceed with colonoscopy and not to go directly to surgery.

## 2014-07-13 NOTE — Anesthesia Procedure Notes (Signed)
Procedure Name: MAC Date/Time: 07/13/2014 2:09 PM Performed by: Carleene Cooper A Pre-anesthesia Checklist: Timeout performed, Patient identified, Emergency Drugs available, Suction available and Patient being monitored Patient Re-evaluated:Patient Re-evaluated prior to inductionOxygen Delivery Method: Simple face mask Dental Injury: Teeth and Oropharynx as per pre-operative assessment

## 2014-07-13 NOTE — Op Note (Signed)
[facility] [place], [address]   COLONOSCOPY PROCEDURE REPORT     EXAM DATE: 07/13/2014  PATIENT NAME:      Edward Patterson, Edward Patterson           MR #:      [id number]  BIRTHDATE:       1965/07/07      VISIT #:     [id number]  ATTENDING:     Inda Castle, MD     STATUS:     outpatient REFERRING MD:      Jill Alexanders, M.D. ASA CLASS:        Class II  INDICATIONS:  The patient is a 49 yr old male here for a colonoscopy due to Polyp removal seen on prior colonoscopy. PROCEDURE PERFORMED:     Submucosal injection, any substance and Colonoscopy with snare polypectomy MEDICATIONS:     Monitored anesthesia care ESTIMATED BLOOD LOSS:     None  CONSENT: The patient understands the risks and benefits of the procedure and understands that these risks include, but are not limited to: sedation, allergic reaction, infection, perforation and/or bleeding. Alternative means of evaluation and treatment include, among others: physical exam, x-rays, and/or surgical intervention. The patient elects to proceed with this endoscopic procedure.  DESCRIPTION OF PROCEDURE: During intra-op preparation period all mechanical & medical equipment was checked for proper function. Hand hygiene and appropriate measures for infection prevention was taken. After the risks, benefits and alternatives of the procedure were thoroughly explained, Informed consent was verified, confirmed and timeout was successfully executed by the treatment team. A digital exam The EC-[id number] ([id number]) endoscope was introduced through the anus and advanced to the cecum, which was identified by both the appendix and ileocecal valve. No adverse events experienced. The prep was excellent using Suprep. The instrument was then slowly withdrawn as the colon was fully examined.   COLON FINDINGS: Again noted there was a sessile 1 cm polyp in the mid ascending colon. In the more distally; there was a 5-6 cm  sessile polyp.  The mucosa and submucosa surrounding the polyp was injected with a total 24 cc normal saline.  8cc of 1:10,000 epinephrine was injected at various times into the polyp for shrinkage.  The polyp was partially removed piecemeal.  because of the broad base of the polyp could not be completely removed.  This area was previously tattooed. there are few diverticula in the ascending colon.  The remainder of the colon was normal. Retroflexed views revealed no abnormalities. The scope was then completely withdrawn from the patient and the procedure terminated. WITHDRAWAL TIME: 73 minutes 0 seconds    ADVERSE EVENTS:      There were no immediate complications.  IMPRESSIONS:     Ag   Incomplete removal of large sessile polyp, ascending colon 1cm sessile poly proximal ascending colon  RECOMMENDATIONS:     Surgical resection RECALL:     pending pathology  Inda Castle, MD eSigned:  Inda Castle, MD 07/13/2014 3:50 PM   cc:  Fanny Skates, MD  CPT CODES: ICD CODES:  The ICD and CPT codes recommended by this software are interpretations from the data that the clinical staff has captured with the software.  The verification of the translation of this report to the ICD and CPT codes and modifiers is the sole responsibility of the health care institution and practicing physician where this report was generated.  [employer] will not be held responsible for  the validity of the ICD and CPT codes included on this report.  AMA assumes no liability for data contained or not contained herein. [employer] is a Designer, television/film set of the [employer].

## 2014-07-13 NOTE — Discharge Instructions (Signed)
No aspirin or NSAIDs for 2 weeks My office will contact you to make an appointment with general surgery (Dr. Fanny Skates)

## 2014-07-13 NOTE — Anesthesia Preprocedure Evaluation (Addendum)
Anesthesia Evaluation  Patient identified by MRN, date of birth, ID band Patient awake    Reviewed: Allergy & Precautions, NPO status , Patient's Chart, lab work & pertinent test results, reviewed documented beta blocker date and time   History of Anesthesia Complications (+) AWARENESS UNDER ANESTHESIA  Airway Mallampati: III  TM Distance: >3 FB Neck ROM: Full    Dental  (+) Teeth Intact, Dental Advisory Given   Pulmonary sleep apnea and Continuous Positive Airway Pressure Ventilation ,  breath sounds clear to auscultation        Cardiovascular hypertension, Pt. on medications and Pt. on home beta blockers Rhythm:Regular Rate:Normal     Neuro/Psych negative neurological ROS     GI/Hepatic negative GI ROS, Neg liver ROS,   Endo/Other  Morbid obesity  Renal/GU negative Renal ROS     Musculoskeletal   Abdominal   Peds  Hematology negative hematology ROS (+)   Anesthesia Other Findings   Reproductive/Obstetrics                            Anesthesia Physical Anesthesia Plan  ASA: II  Anesthesia Plan: MAC   Post-op Pain Management:    Induction: Intravenous  Airway Management Planned: Natural Airway and Simple Face Mask  Additional Equipment:   Intra-op Plan:   Post-operative Plan:   Informed Consent: I have reviewed the patients History and Physical, chart, labs and discussed the procedure including the risks, benefits and alternatives for the proposed anesthesia with the patient or authorized representative who has indicated his/her understanding and acceptance.     Plan Discussed with: CRNA  Anesthesia Plan Comments:         Anesthesia Quick Evaluation

## 2014-07-14 ENCOUNTER — Encounter (HOSPITAL_COMMUNITY): Payer: Self-pay | Admitting: Gastroenterology

## 2014-07-14 NOTE — Telephone Encounter (Signed)
Appointment scheduled and patient notified. Scheduled for 07/27/14 at 2:45 pm. Advised patient a new patient information packet will arrive from [facility] through the mail. Phone number also provided.

## 2014-07-14 NOTE — Anesthesia Postprocedure Evaluation (Signed)
  Anesthesia Post-op Note  Patient: Edward Patterson  Procedure(s) Performed: Procedure(s): COLONOSCOPY WITH PROPOFOL (N/A) HOT HEMOSTASIS (ARGON PLASMA COAGULATION/BICAP) (N/A)  Patient Location: PACU  Anesthesia Type:MAC  Level of Consciousness: awake and alert   Airway and Oxygen Therapy: Patient Spontanous Breathing  Post-op Pain: none  Post-op Assessment: Post-op Vital signs reviewed  Post-op Vital Signs: Reviewed  Last Vitals:  Filed Vitals:   07/13/14 1610  BP: 176/119  Pulse: 72  Temp:   Resp: 15    Complications: No apparent anesthesia complications

## 2014-07-14 NOTE — Telephone Encounter (Signed)
-----   Message from Inda Castle, MD sent at 07/13/2014  3:51 PM EST ----- Eustaquio Maize, Please make an appointment for this patient to see Dr. Fanny Skates from [facility] surgery.  Renelda Loma, This patient has a large polyp in the area of the hepatic flexure/descending colon that I could not remove.  The area was tattooed.  This also a second a; polyp which I left alone.  Pathology demonstrated adenomatous changes only area he'll require surgical resection. Rob

## 2014-07-16 ENCOUNTER — Other Ambulatory Visit: Payer: Self-pay

## 2014-07-16 ENCOUNTER — Telehealth: Payer: Self-pay | Admitting: Gastroenterology

## 2014-07-16 DIAGNOSIS — C189 Malignant neoplasm of colon, unspecified: Secondary | ICD-10-CM

## 2014-07-16 NOTE — Telephone Encounter (Signed)
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  He is already scheduled for surgery.  Patient appears to understand and will call back with any further questions.

## 2014-07-17 ENCOUNTER — Ambulatory Visit (INDEPENDENT_AMBULATORY_CARE_PROVIDER_SITE_OTHER)
Admission: RE | Admit: 2014-07-17 | Discharge: 2014-07-17 | Disposition: A | Discharge status: Home / Self Care | Payer: BC Managed Care – PPO | Source: Ambulatory Visit | Attending: Gastroenterology | Admitting: Gastroenterology

## 2014-07-17 ENCOUNTER — Other Ambulatory Visit: Payer: BC Managed Care – PPO

## 2014-07-17 DIAGNOSIS — C189 Malignant neoplasm of colon, unspecified: Secondary | ICD-10-CM

## 2014-07-17 MED ORDER — IOHEXOL 300 MG/ML  SOLN
100.0000 mL | Freq: Once | INTRAMUSCULAR | Status: AC | PRN
  Administered 2014-07-17: 100 mL via INTRAVENOUS

## 2014-07-20 ENCOUNTER — Telehealth: Payer: Self-pay | Admitting: Gastroenterology

## 2014-07-20 NOTE — Telephone Encounter (Signed)
Spoke with patient.

## 2014-07-20 NOTE — Telephone Encounter (Signed)
CT is in his chart.

## 2014-07-20 NOTE — Progress Notes (Signed)
Quick Note:  Please inform the patient that CT was normal and to continue current plan of action ______

## 2014-07-22 ENCOUNTER — Other Ambulatory Visit (INDEPENDENT_AMBULATORY_CARE_PROVIDER_SITE_OTHER): Payer: Self-pay | Admitting: General Surgery

## 2014-07-28 ENCOUNTER — Other Ambulatory Visit: Payer: Self-pay | Admitting: Medical

## 2014-08-04 ENCOUNTER — Telehealth: Payer: Self-pay | Admitting: Family Medicine

## 2014-08-04 NOTE — Telephone Encounter (Signed)
rcd fax from [facility] regarding Traimterene HCTZ which states medication is not available, please send alternative.

## 2014-08-04 NOTE — Telephone Encounter (Signed)
Patient is aware of Audelia Acton tysinger PA message ansd he states that he has not had a chance to speak with the pharmacy but he will check with them and get back with me on this issue

## 2014-08-04 NOTE — Telephone Encounter (Signed)
Did he see if pharmacy checked around for availability? I'm not aware of a backorder on dyazide as this is a common generic medication.   He could even try to see if different pharmacy has this.  If not, or if on back order, will need to come in and recheck BP and discussed options.

## 2014-08-06 ENCOUNTER — Other Ambulatory Visit: Payer: Self-pay | Admitting: Medical

## 2014-08-06 MED ORDER — CHLORTHALIDONE 25 MG PO TABS: 25.0000 mg | ORAL_TABLET | Freq: Every day | ORAL | Status: DC

## 2014-08-06 NOTE — Progress Notes (Signed)
Patient notified of medication changes and need to follow up in one month.

## 2014-08-14 DIAGNOSIS — C189 Malignant neoplasm of colon, unspecified: Secondary | ICD-10-CM

## 2014-08-14 HISTORY — PX: APPENDECTOMY: SHX54

## 2014-08-14 HISTORY — DX: Malignant neoplasm of colon, unspecified: C18.9

## 2014-08-17 NOTE — Patient Instructions (Addendum)
Edward Patterson  08/17/2014   Your procedure is scheduled on: Friday 08/28/2014  Report to [facility] Main  Entrance and follow signs to               Secor at 0530 AM.  Call this number if you have problems the morning of surgery [phone]   Remember:  Follow bowel prep instructions from Dr. Darrel Hoover office with a clear liquid diet  Bring CPAP mask and tubing  Do not eat food or drink liquids :After Midnight.   CLEAR LIQUID DIET   Foods Allowed                                                                     Foods Excluded  Coffee and tea, regular and decaf                             liquids that you cannot  Plain Jell-O in any flavor                                             see through such as: Fruit ices (not with fruit pulp)                                     milk, soups, orange juice  Iced Popsicles                                    All solid food Carbonated beverages, regular and diet                                    Cranberry, grape and apple juices Sports drinks like Gatorade Lightly seasoned clear broth or consume(fat free) Sugar, honey syrup  Sample Menu Breakfast                                Lunch                                     Supper Cranberry juice                    Beef broth                            Chicken broth Jell-O                                     Grape juice  Apple juice Coffee or tea                        Jell-O                                      Popsicle                                                Coffee or tea                        Coffee or tea  _____________________________________________________________________       Take these medicines the morning of surgery with A SIP OF WATER: Carvedilol                               You may not have any metal on your body including hair pins and              piercings  Do not wear jewelry, make-up, lotions, powders  or perfumes.             Do not wear nail polish.  Do not shave  48 hours prior to surgery.              Men may shave face and neck.   Do not bring valuables to the hospital. [place].  Contacts, dentures or bridgework may not be worn into surgery.  Leave suitcase in the car. After surgery it may be brought to your room.     Patients discharged the day of surgery will not be allowed to drive home.  Name and phone number of your driver:  Special Instructions: N/A              Please read over the following fact sheets you were given: _____________________________________________________________________             [facility] - Preparing for Surgery Before surgery, you can play an important role.  Because skin is not sterile, your skin needs to be as free of germs as possible.  You can reduce the number of germs on your skin by washing with CHG (chlorahexidine gluconate) soap before surgery.  CHG is an antiseptic cleaner which kills germs and bonds with the skin to continue killing germs even after washing. Please DO NOT use if you have an allergy to CHG or antibacterial soaps.  If your skin becomes reddened/irritated stop using the CHG and inform your nurse when you arrive at Short Stay. Do not shave (including legs and underarms) for at least 48 hours prior to the first CHG shower.  You may shave your face/neck. Please follow these instructions carefully:  1.  Shower with CHG Soap the night before surgery and the  morning of Surgery.  2.  If you choose to wash your hair, wash your hair first as usual with your  normal  shampoo.  3.  After you shampoo, rinse your hair and body thoroughly to remove the  shampoo.  4.  Use CHG as you would any other liquid soap.  You can apply chg directly  to the skin and wash                       Gently with a scrungie or clean washcloth.  5.  Apply the CHG Soap to your body ONLY  FROM THE NECK DOWN.   Do not use on face/ open                           Wound or open sores. Avoid contact with eyes, ears mouth and genitals (private parts).                       Wash face,  Genitals (private parts) with your normal soap.             6.  Wash thoroughly, paying special attention to the area where your surgery  will be performed.  7.  Thoroughly rinse your body with warm water from the neck down.  8.  DO NOT shower/wash with your normal soap after using and rinsing off  the CHG Soap.                9.  Pat yourself dry with a clean towel.            10.  Wear clean pajamas.            11.  Place clean sheets on your bed the night of your first shower and do not  sleep with pets. Day of Surgery : Do not apply any lotions/deodorants the morning of surgery.  Please wear clean clothes to the hospital/surgery center.  FAILURE TO FOLLOW THESE INSTRUCTIONS MAY RESULT IN THE CANCELLATION OF YOUR SURGERY PATIENT SIGNATURE_________________________________  NURSE SIGNATURE__________________________________  ________________________________________________________________________   Adam Phenix  An incentive spirometer is a tool that can help keep your lungs clear and active. This tool measures how well you are filling your lungs with each breath. Taking long deep breaths may help reverse or decrease the chance of developing breathing (pulmonary) problems (especially infection) following:  A long period of time when you are unable to move or be active. BEFORE THE PROCEDURE   If the spirometer includes an indicator to show your best effort, your nurse or respiratory therapist will set it to a desired goal.  If possible, sit up straight or lean slightly forward. Try not to slouch.  Hold the incentive spirometer in an upright position. INSTRUCTIONS FOR USE   Sit on the edge of your bed if possible, or sit up as far as you can in bed or on a chair.  Hold the incentive  spirometer in an upright position.  Breathe out normally.  Place the mouthpiece in your mouth and seal your lips tightly around it.  Breathe in slowly and as deeply as possible, raising the piston or the ball toward the top of the column.  Hold your breath for 3-5 seconds or for as long as possible. Allow the piston or ball to fall to the bottom of the column.  Remove the mouthpiece from your mouth and breathe out normally.  Rest for a few seconds and repeat Steps 1 through 7 at least 10 times every 1-2 hours when you are awake. Take your time and take a few normal breaths between deep breaths.  The spirometer may include an indicator to  show your best effort. Use the indicator as a goal to work toward during each repetition.  After each set of 10 deep breaths, practice coughing to be sure your lungs are clear. If you have an incision (the cut made at the time of surgery), support your incision when coughing by placing a pillow or rolled up towels firmly against it. Once you are able to get out of bed, walk around indoors and cough well. You may stop using the incentive spirometer when instructed by your caregiver.  RISKS AND COMPLICATIONS  Take your time so you do not get dizzy or light-headed.  If you are in pain, you may need to take or ask for pain medication before doing incentive spirometry. It is harder to take a deep breath if you are having pain. AFTER USE  Rest and breathe slowly and easily.  It can be helpful to keep track of a log of your progress. Your caregiver can provide you with a simple table to help with this. If you are using the spirometer at home, follow these instructions: [place] IF:   You are having difficultly using the spirometer.  You have trouble using the spirometer as often as instructed.  Your pain medication is not giving enough relief while using the spirometer.  You develop fever of 100.5 F (38.1 C) or higher. SEEK IMMEDIATE MEDICAL  CARE IF:   You cough up bloody sputum that had not been present before.  You develop fever of 102 F (38.9 C) or greater.  You develop worsening pain at or near the incision site. MAKE SURE YOU:   Understand these instructions.  Will watch your condition.  Will get help right away if you are not doing well or get worse. Document Released: 09/11/2006 Document Revised: 07/24/2011 Document Reviewed: 11/12/2006 ExitCare Patient Information 2014 ExitCare, [place].   ________________________________________________________________________  WHAT IS A BLOOD TRANSFUSION? Blood Transfusion Information  A transfusion is the replacement of blood or some of its parts. Blood is made up of multiple cells which provide different functions.  Red blood cells carry oxygen and are used for blood loss replacement.  White blood cells fight against infection.  Platelets control bleeding.  Plasma helps clot blood.  Other blood products are available for specialized needs, such as hemophilia or other clotting disorders. BEFORE THE TRANSFUSION  Who gives blood for transfusions?   Healthy volunteers who are fully evaluated to make sure their blood is safe. This is blood bank blood. Transfusion therapy is the safest it has ever been in the practice of medicine. Before blood is taken from a donor, a complete history is taken to make sure that person has no history of diseases nor engages in risky social behavior (examples are intravenous drug use or sexual activity with multiple partners). The donor's travel history is screened to minimize risk of transmitting infections, such as malaria. The donated blood is tested for signs of infectious diseases, such as HIV and hepatitis. The blood is then tested to be sure it is compatible with you in order to minimize the chance of a transfusion reaction. If you or a relative donates blood, this is often done in anticipation of surgery and is not appropriate for  emergency situations. It takes many days to process the donated blood. RISKS AND COMPLICATIONS Although transfusion therapy is very safe and saves many lives, the main dangers of transfusion include:   Getting an infectious disease.  Developing a transfusion reaction. This is an allergic reaction  to something in the blood you were given. Every precaution is taken to prevent this. The decision to have a blood transfusion has been considered carefully by your caregiver before blood is given. Blood is not given unless the benefits outweigh the risks. AFTER THE TRANSFUSION  Right after receiving a blood transfusion, you will usually feel much better and more energetic. This is especially true if your red blood cells have gotten low (anemic). The transfusion raises the level of the red blood cells which carry oxygen, and this usually causes an energy increase.  The nurse administering the transfusion will monitor you carefully for complications. HOME CARE INSTRUCTIONS  No special instructions are needed after a transfusion. You may find your energy is better. Speak with your caregiver about any limitations on activity for underlying diseases you may have. SEEK MEDICAL CARE IF:   Your condition is not improving after your transfusion.  You develop redness or irritation at the intravenous (IV) site. SEEK IMMEDIATE MEDICAL CARE IF:  Any of the following symptoms occur over the next 12 hours:  Shaking chills.  You have a temperature by mouth above 102 F (38.9 C), not controlled by medicine.  Chest, back, or muscle pain.  People around you feel you are not acting correctly or are confused.  Shortness of breath or difficulty breathing.  Dizziness and fainting.  You get a rash or develop hives.  You have a decrease in urine output.  Your urine turns a dark color or changes to pink, red, or brown. Any of the following symptoms occur over the next 10 days:  You have a temperature by  mouth above 102 F (38.9 C), not controlled by medicine.  Shortness of breath.  Weakness after normal activity.  The white part of the eye turns yellow (jaundice).  You have a decrease in the amount of urine or are urinating less often.  Your urine turns a dark color or changes to pink, red, or brown. Document Released: 04/28/2000 Document Revised: 07/24/2011 Document Reviewed: 12/16/2007 [facility] Patient Information 2014 [place], [place].  _______________________________________________________________________

## 2014-08-19 ENCOUNTER — Encounter (HOSPITAL_COMMUNITY)
Admission: RE | Admit: 2014-08-19 | Discharge: 2014-08-19 | Disposition: A | Discharge status: Home / Self Care | Payer: BC Managed Care – PPO | Source: Ambulatory Visit | Attending: General Surgery | Admitting: General Surgery

## 2014-08-19 ENCOUNTER — Encounter (HOSPITAL_COMMUNITY): Payer: Self-pay

## 2014-08-19 DIAGNOSIS — Z01812 Encounter for preprocedural laboratory examination: Secondary | ICD-10-CM | POA: Diagnosis not present

## 2014-08-19 LAB — CBC
HEMATOCRIT: 47.5 % (ref 39.0–52.0)
HEMOGLOBIN: 15.8 g/dL (ref 13.0–17.0)
MCH: 29.2 pg (ref 26.0–34.0)
MCHC: 33.3 g/dL (ref 30.0–36.0)
MCV: 87.6 fL (ref 78.0–100.0)
Platelets: 185 10*3/uL (ref 150–400)
RBC: 5.42 MIL/uL (ref 4.22–5.81)
RDW: 13.5 % (ref 11.5–15.5)
WBC: 3.3 10*3/uL — ABNORMAL LOW (ref 4.0–10.5)

## 2014-08-19 LAB — BASIC METABOLIC PANEL
ANION GAP: 10 (ref 5–15)
BUN: 20 mg/dL (ref 6–23)
CALCIUM: 9.2 mg/dL (ref 8.4–10.5)
CO2: 26 mmol/L (ref 19–32)
Chloride: 101 mmol/L (ref 96–112)
Creatinine, Ser: 1.03 mg/dL (ref 0.50–1.35)
GFR calc Af Amer: >90 mL/min (ref >90)
GFR, EST NON AFRICAN AMERICAN: 83 mL/min — AB (ref >90)
Glucose, Bld: 98 mg/dL (ref 70–99)
POTASSIUM: 3.6 mmol/L (ref 3.5–5.1)
Sodium: 137 mmol/L (ref 135–145)

## 2014-08-19 NOTE — Consult Note (Signed)
WOC ostomy consult note Consulted for preoperative stoma site marking for partial colectomy of ascending colon.   Education provided:  Patient is seated and we discussed colostomy, what to expect postoperatively and that [place] team would be seeing him after surgery for ongoing self care education.  Discussed activity level and that he can still continue his hobbies, "just being outdoors" after surgery once he has recovered. Patient has questions regarding frequency of stool, dietary changes and learning self care.  Questions are answered.  Patient is married.  Wife is not present today, but explained that she could be involved in post operative teaching as well.   Patient is observed standing, seated and lying down.  Has round abdomen, but stoma site is identified in RLQ and LLQ that patient is able to visualize and is within the rectus muscle.  Sites are 3 cm left and 3 cm below umbilicus as well as 3 cm right and 3 cm below umbilicus.  There is some question if surgery will be laparascopic or open.  Patient prepared for either one, discussed what to expect postoperatively with either case.   Written materials are provided for patient to read and prepare for upcoming procedure.  Patient reassured that our team will continue to follow him and he is relieved that he will have this.   [place] team will remain available to this patient, medical and nursing teams.  Please re-consult after surgery.   Domenic Moras RN BSN [place] Pager [phone]

## 2014-08-20 LAB — HEMOGLOBIN A1C
Hgb A1c MFr Bld: 5.9 % — ABNORMAL HIGH (ref 4.8–5.6)
Mean Plasma Glucose: 123 mg/dL

## 2014-08-20 NOTE — Progress Notes (Signed)
Abnormal HGA1C routed to Dr. Dalbert Batman

## 2014-08-25 NOTE — H&P (Signed)
Edward Patterson  Location: [facility] Surgery Patient #: [id number] DOB: 27-Sep-1965 Married / Language: English / Race: Black or African American Male       History of Present Illness  Patient words: Colonic Polyp.  The patient is a 49 year old male who presents with colorectal cancer. This is a 49 year old African-American man, referred by 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 [name] Is His PCP. The patient has no gastrointestinal symptoms. He underwent his first screening colonoscopy on June 03, 2014. A large polyp at the hepatic flexure was noted and biopsied and a 12 mm polyp in ascending colon was noted. Multiple polyp fragments showed adenomatous changes and some dysplasia. Biopsy of the hepatic flexure adenoma with ulceration. He was taken back for colonoscopic resection on February 29 but had an incomplete resection because the deeper layers were very dense. The partial resection showed invasive adenocarcinoma. He has had a CT scan which shows a mass near the hepatic flexure. Small benign-appearing lymph nodes. No metastasis. 2.4 cm hepatic hemangioma right lobe. He is aware of his diagnosis. He is here today with his wife. He is in no distress. Comorbidities include obesity with BMI of 37. Obstructive sleep apnea. Hypertension on 3 meds. Otherwise no medical or surgical problems. No prior abdominal operations. Family history reveals colon cancer in the sister, surgery last year, getting chemotherapy now. Mother had colon cancer and died of progressive disease. He is ready to go ahead and schedule for laparoscopic-assisted right colectomy. I discussed the indications, details, techniques, and numerous risk of the surgery with him and his wife. He is aware the risk of bleeding, infection, hernia, anastomotic leak with reoperation for complications, injury to adjacent organs with reoperation for  complications, cardiac pulmonary and thromboembolic problems. He understands all of these issues well. At this time all of his questions are answered. He agrees with this plan.  Married. Works for [employer] and [employer].    Other Problems  High blood pressure Sleep Apnea  Past Surgical History  Colon Polyp Removal - Colonoscopy Colon Polyp Removal - Open  Diagnostic Studies History  Colonoscopy within last year  Allergies No Known Drug Allergies03/01/2015  Medication History  Medications Reconciled Coreg (12.5MG  Tablet, Oral daily) Active. Lisinopril (20MG  Tablet, Oral daily) Active. Multiple Vitamin (Oral daily) Active. Omega 3-6-9 Fatty Acids (Oral daily) Active. Dyazide (37.5-25MG  Capsule, Oral daily) Active.  Social History Alcohol use Occasional alcohol use. Tobacco use Never smoker.  Family History  Family history unknown First Degree Relatives  Review of Systems General Not Present- Appetite Loss, Chills, Fatigue, Fever, Night Sweats, Weight Gain and Weight Loss. Skin Not Present- Change in Wart/Mole, Dryness, Hives, Jaundice, New Lesions, Non-Healing Wounds, Rash and Ulcer. HEENT Not Present- Earache, Hearing Loss, Hoarseness, Nose Bleed, Oral Ulcers, Ringing in the Ears, Seasonal Allergies, Sinus Pain, Sore Throat, Visual Disturbances, Wears glasses/contact lenses and Yellow Eyes. Respiratory Not Present- Bloody sputum, Chronic Cough, Difficulty Breathing, Snoring and Wheezing. Breast Not Present- Breast Mass, Breast Pain, Nipple Discharge and Skin Changes. Cardiovascular Not Present- Chest Pain, Difficulty Breathing Lying Down, Leg Cramps, Palpitations, Rapid Heart Rate, Shortness of Breath and Swelling of Extremities. Gastrointestinal Not Present- Abdominal Pain, Bloating, Bloody Stool, Change in Bowel Habits, Chronic diarrhea, Constipation, Difficulty Swallowing, Excessive gas, Gets full quickly at meals, Hemorrhoids,  Indigestion, Nausea, Rectal Pain and Vomiting. Male Genitourinary Not Present- Blood in Urine, Change in Urinary Stream, Frequency, Impotence, Nocturia, Painful  Urination, Urgency and Urine Leakage.   Vitals   Weight: 264.6 lb Height: 70in Body Surface Area: 2.43 m Body Mass Index: 37.97 kg/m Temp.: 98.28F(Oral)  Pulse: 76 (Regular)  Resp.: 15 (Unlabored)  BP: 140/90 (Sitting, Left Arm, Standard)    Physical Exam  General Mental Status-Alert. General Appearance-Consistent with stated age. Hydration-Well hydrated. Voice-Normal.  Head and Neck Head-normocephalic, atraumatic with no lesions or palpable masses. Trachea-midline. Thyroid Gland Characteristics - normal size and consistency.  Eye Eyeball - Bilateral-Extraocular movements intact. Sclera/Conjunctiva - Bilateral-No scleral icterus.  Chest and Lung Exam Chest and lung exam reveals -quiet, even and easy respiratory effort with no use of accessory muscles and on auscultation, normal breath sounds, no adventitious sounds and normal vocal resonance. Inspection Chest Wall - Normal. Back - normal.  Cardiovascular Cardiovascular examination reveals -normal heart sounds, regular rate and rhythm with no murmurs and normal pedal pulses bilaterally.  Abdomen Inspection Inspection of the abdomen reveals - No Hernias. Skin - Scar - no surgical scars. Palpation/Percussion Palpation and Percussion of the abdomen reveal - Soft, Non Tender, No Rebound tenderness, No Rigidity (guarding) and No hepatosplenomegaly. Auscultation Auscultation of the abdomen reveals - Bowel sounds normal. Note: Large man. Large abdomen. Nontender. No hernias. No masses. Liver not enlarged.   Male Genitourinary Note: No inguinal hernia. No inguinal adenopathy.   Neurologic Neurologic evaluation reveals -alert and oriented x 3 with no impairment of recent or remote memory. Mental  Status-Normal.  Musculoskeletal Normal Exam - Left-Upper Extremity Strength Normal and Lower Extremity Strength Normal. Normal Exam - Right-Upper Extremity Strength Normal and Lower Extremity Strength Normal.  Lymphatic Head & Neck  General Head & Neck Lymphatics: Bilateral - Description - Normal. Axillary  General Axillary Region: Bilateral - Description - Normal. Tenderness - Non Tender. Femoral & Inguinal  Generalized Femoral & Inguinal Lymphatics: Bilateral - Description - Normal. Tenderness - Non Tender.    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 Local Order: TAKE TWO TABLETS AT 2 PM, 3 PM, AND 10 PM THE DAY PRIOR TO SURGERY Started Flagyl 500MG , 2 (two) Tablet SEE NOTE, #6, 07/22/2014, No Refill. Local Order: Take at 2pm, 3pm, and 10pm the day prior to your colon operation Schedule for Surgery You have a cancer of the right colon, as we described. you will be scheduled for an operation to remove the section of your large intestine that contains the cancer. Part of this operation will be done laparoscopically and part of it will be done open. You will undergo a bowel prep the day before surgery and you will also take antibiotic pills the day before surgery. We have discussed the indications, techniques, and numerous risk of the surgery in detail please read the patient information booklets that I gave you. We will schedule the surgery at [facility] long at your request  BMI 37.0-37.9, ADULT (V85.37  Z68.37)  SLEEP APNEA IN ADULT (327.23  G47.33) Impression: Uses CPAP at night  HYPERTENSION, BENIGN (401.1  I10) Impression: Takes lisinopril, Coreg, Dyazide    Tahiri Shareef M. Dalbert Batman, M.D., [facility] General and Minimally invasive Surgery Breast and Colorectal Surgery Office:   [phone] Pager:    [phone]

## 2014-08-28 ENCOUNTER — Encounter (HOSPITAL_COMMUNITY): Payer: Self-pay | Admitting: *Deleted

## 2014-08-28 ENCOUNTER — Inpatient Hospital Stay (HOSPITAL_COMMUNITY): Payer: BC Managed Care – PPO | Admitting: Registered Nurse

## 2014-08-28 ENCOUNTER — Encounter (HOSPITAL_COMMUNITY)
Admission: RE | Disposition: A | Discharge status: Home / Self Care | Payer: Self-pay | Source: Ambulatory Visit | Attending: General Surgery

## 2014-08-28 ENCOUNTER — Inpatient Hospital Stay (HOSPITAL_COMMUNITY)
Admission: RE | Admit: 2014-08-28 | Discharge: 2014-09-01 | DRG: 331 | Disposition: A | Discharge status: Home / Self Care | Payer: BC Managed Care – PPO | Source: Ambulatory Visit | Attending: General Surgery | Admitting: General Surgery

## 2014-08-28 DIAGNOSIS — Z86010 Personal history of colonic polyps: Secondary | ICD-10-CM | POA: Diagnosis not present

## 2014-08-28 DIAGNOSIS — C182 Malignant neoplasm of ascending colon: Secondary | ICD-10-CM | POA: Diagnosis present

## 2014-08-28 DIAGNOSIS — E669 Obesity, unspecified: Secondary | ICD-10-CM | POA: Diagnosis present

## 2014-08-28 DIAGNOSIS — E876 Hypokalemia: Secondary | ICD-10-CM | POA: Diagnosis not present

## 2014-08-28 DIAGNOSIS — Z6837 Body mass index (BMI) 37.0-37.9, adult: Secondary | ICD-10-CM | POA: Diagnosis not present

## 2014-08-28 DIAGNOSIS — G4733 Obstructive sleep apnea (adult) (pediatric): Secondary | ICD-10-CM | POA: Diagnosis present

## 2014-08-28 DIAGNOSIS — K635 Polyp of colon: Secondary | ICD-10-CM | POA: Diagnosis present

## 2014-08-28 DIAGNOSIS — I10 Essential (primary) hypertension: Secondary | ICD-10-CM | POA: Diagnosis present

## 2014-08-28 HISTORY — PX: LAPAROSCOPIC PARTIAL COLECTOMY: SHX5907

## 2014-08-28 LAB — TYPE AND SCREEN
ABO/RH(D): O POS
Antibody Screen: NEGATIVE

## 2014-08-28 LAB — CBC
HCT: 45.3 % (ref 39.0–52.0)
HEMOGLOBIN: 15.1 g/dL (ref 13.0–17.0)
MCH: 28.7 pg (ref 26.0–34.0)
MCHC: 33.3 g/dL (ref 30.0–36.0)
MCV: 86 fL (ref 78.0–100.0)
Platelets: 212 10*3/uL (ref 150–400)
RBC: 5.27 MIL/uL (ref 4.22–5.81)
RDW: 13.1 % (ref 11.5–15.5)
WBC: 9.3 10*3/uL (ref 4.0–10.5)

## 2014-08-28 LAB — CREATININE, SERUM
Creatinine, Ser: 1.06 mg/dL (ref 0.50–1.35)
GFR calc Af Amer: >90 mL/min (ref >90)
GFR calc non Af Amer: 80 mL/min — ABNORMAL LOW (ref >90)

## 2014-08-28 LAB — ABO/RH: ABO/RH(D): O POS

## 2014-08-28 SURGERY — LAPAROSCOPIC PARTIAL COLECTOMY
Anesthesia: General

## 2014-08-28 MED ORDER — DEXAMETHASONE SODIUM PHOSPHATE 10 MG/ML IJ SOLN
INTRAMUSCULAR | Status: DC | PRN
  Administered 2014-08-28: 10 mg via INTRAVENOUS

## 2014-08-28 MED ORDER — HYDROMORPHONE HCL 1 MG/ML IJ SOLN
INTRAMUSCULAR | Status: AC
  Filled 2014-08-28: qty 1

## 2014-08-28 MED ORDER — MIDAZOLAM HCL 2 MG/2ML IJ SOLN
INTRAMUSCULAR | Status: AC
  Filled 2014-08-28: qty 2

## 2014-08-28 MED ORDER — SUCCINYLCHOLINE CHLORIDE 20 MG/ML IJ SOLN
INTRAMUSCULAR | Status: DC | PRN
  Administered 2014-08-28: 120 mg via INTRAVENOUS

## 2014-08-28 MED ORDER — NEOSTIGMINE METHYLSULFATE 10 MG/10ML IV SOLN
INTRAVENOUS | Status: DC | PRN
  Administered 2014-08-28: 5 mg via INTRAVENOUS

## 2014-08-28 MED ORDER — MULTIVITAMINS PO CAPS: 1.0000 | ORAL_CAPSULE | Freq: Every morning | ORAL | Status: DC

## 2014-08-28 MED ORDER — HYDRALAZINE HCL 20 MG/ML IJ SOLN
3.0000 mg | Freq: Once | INTRAMUSCULAR | Status: AC
  Administered 2014-08-28: 3 mg via INTRAVENOUS

## 2014-08-28 MED ORDER — HYDROMORPHONE HCL 1 MG/ML IJ SOLN
INTRAMUSCULAR | Status: DC | PRN
  Administered 2014-08-28 (×4): 0.5 mg via INTRAVENOUS

## 2014-08-28 MED ORDER — PROPOFOL 10 MG/ML IV BOLUS
INTRAVENOUS | Status: DC | PRN
  Administered 2014-08-28: 250 mg via INTRAVENOUS

## 2014-08-28 MED ORDER — HYDRALAZINE HCL 20 MG/ML IJ SOLN
INTRAMUSCULAR | Status: AC
  Filled 2014-08-28: qty 1

## 2014-08-28 MED ORDER — LACTATED RINGERS IV SOLN
INTRAVENOUS | Status: DC | PRN
  Administered 2014-08-28: 07:00:00 via INTRAVENOUS

## 2014-08-28 MED ORDER — FENTANYL CITRATE (PF) 100 MCG/2ML IJ SOLN
INTRAMUSCULAR | Status: DC | PRN
  Administered 2014-08-28 (×5): 50 ug via INTRAVENOUS

## 2014-08-28 MED ORDER — SODIUM CHLORIDE 0.9 % IJ SOLN
INTRAMUSCULAR | Status: AC
  Filled 2014-08-28: qty 10

## 2014-08-28 MED ORDER — ALUM & MAG HYDROXIDE-SIMETH 200-200-20 MG/5ML PO SUSP: 30.0000 mL | Freq: Four times a day (QID) | ORAL | Status: DC | PRN

## 2014-08-28 MED ORDER — ONDANSETRON HCL 4 MG PO TABS: 4.0000 mg | ORAL_TABLET | Freq: Four times a day (QID) | ORAL | Status: DC | PRN

## 2014-08-28 MED ORDER — MEPERIDINE HCL 50 MG/ML IJ SOLN: 6.2500 mg | INTRAMUSCULAR | Status: DC | PRN

## 2014-08-28 MED ORDER — CHLORHEXIDINE GLUCONATE 4 % EX LIQD: 1.0000 "application " | Freq: Once | CUTANEOUS | Status: DC

## 2014-08-28 MED ORDER — HYDROMORPHONE HCL 1 MG/ML IJ SOLN
0.2500 mg | INTRAMUSCULAR | Status: DC | PRN
  Administered 2014-08-28: 0.5 mg via INTRAVENOUS
  Administered 2014-08-28 (×2): 0.25 mg via INTRAVENOUS

## 2014-08-28 MED ORDER — LACTATED RINGERS IV SOLN: INTRAVENOUS | Status: DC

## 2014-08-28 MED ORDER — LIDOCAINE HCL (CARDIAC) 20 MG/ML IV SOLN
INTRAVENOUS | Status: DC | PRN
  Administered 2014-08-28: 100 mg via INTRAVENOUS

## 2014-08-28 MED ORDER — CARVEDILOL 12.5 MG PO TABS
12.5000 mg | ORAL_TABLET | Freq: Two times a day (BID) | ORAL | Status: DC
  Administered 2014-08-28 – 2014-09-01 (×8): 12.5 mg via ORAL
  Filled 2014-08-28 (×11): qty 1

## 2014-08-28 MED ORDER — ONDANSETRON HCL 4 MG/2ML IJ SOLN: 4.0000 mg | Freq: Four times a day (QID) | INTRAMUSCULAR | Status: DC | PRN

## 2014-08-28 MED ORDER — NEOSTIGMINE METHYLSULFATE 10 MG/10ML IV SOLN
INTRAVENOUS | Status: AC
  Filled 2014-08-28: qty 1

## 2014-08-28 MED ORDER — MIDAZOLAM HCL 5 MG/5ML IJ SOLN
INTRAMUSCULAR | Status: DC | PRN
  Administered 2014-08-28: 2 mg via INTRAVENOUS

## 2014-08-28 MED ORDER — HYDROMORPHONE HCL 2 MG/ML IJ SOLN
INTRAMUSCULAR | Status: AC
  Filled 2014-08-28: qty 1

## 2014-08-28 MED ORDER — POTASSIUM CHLORIDE IN NACL 20-0.9 MEQ/L-% IV SOLN
INTRAVENOUS | Status: DC
  Administered 2014-08-28: 1000 mL via INTRAVENOUS
  Administered 2014-08-28: 13:00:00 via INTRAVENOUS
  Administered 2014-08-29: 1000 mL via INTRAVENOUS
  Filled 2014-08-28 (×4): qty 1000

## 2014-08-28 MED ORDER — ALVIMOPAN 12 MG PO CAPS
12.0000 mg | ORAL_CAPSULE | Freq: Two times a day (BID) | ORAL | Status: DC
  Administered 2014-08-29 – 2014-08-30 (×3): 12 mg via ORAL
  Filled 2014-08-28 (×4): qty 1

## 2014-08-28 MED ORDER — OXYCODONE-ACETAMINOPHEN 5-325 MG PO TABS
1.0000 | ORAL_TABLET | ORAL | Status: DC | PRN
  Administered 2014-08-29 – 2014-08-30 (×3): 2 via ORAL
  Administered 2014-08-30: 1 via ORAL
  Administered 2014-08-30 – 2014-09-01 (×4): 2 via ORAL
  Filled 2014-08-28 (×8): qty 2

## 2014-08-28 MED ORDER — CEFOTETAN DISODIUM-DEXTROSE 2-2.08 GM-% IV SOLR
INTRAVENOUS | Status: AC
  Filled 2014-08-28: qty 50

## 2014-08-28 MED ORDER — LIDOCAINE HCL (CARDIAC) 20 MG/ML IV SOLN
INTRAVENOUS | Status: AC
  Filled 2014-08-28: qty 5

## 2014-08-28 MED ORDER — LISINOPRIL 20 MG PO TABS
20.0000 mg | ORAL_TABLET | Freq: Every day | ORAL | Status: DC
  Administered 2014-08-28 – 2014-08-31 (×4): 20 mg via ORAL
  Filled 2014-08-28 (×5): qty 1

## 2014-08-28 MED ORDER — DEXTROSE 5 % IV SOLN
2.0000 g | INTRAVENOUS | Status: AC
  Administered 2014-08-28: 2 g via INTRAVENOUS
  Filled 2014-08-28: qty 2

## 2014-08-28 MED ORDER — ROCURONIUM BROMIDE 100 MG/10ML IV SOLN
INTRAVENOUS | Status: AC
  Filled 2014-08-28: qty 1

## 2014-08-28 MED ORDER — ONDANSETRON HCL 4 MG/2ML IJ SOLN
INTRAMUSCULAR | Status: AC
  Filled 2014-08-28: qty 2

## 2014-08-28 MED ORDER — CHLORTHALIDONE 25 MG PO TABS
25.0000 mg | ORAL_TABLET | Freq: Every day | ORAL | Status: DC
  Administered 2014-08-28 – 2014-08-31 (×4): 25 mg via ORAL
  Filled 2014-08-28 (×5): qty 1

## 2014-08-28 MED ORDER — 0.9 % SODIUM CHLORIDE (POUR BTL) OPTIME
TOPICAL | Status: DC | PRN
  Administered 2014-08-28: 3000 mL

## 2014-08-28 MED ORDER — PROPOFOL 10 MG/ML IV BOLUS
INTRAVENOUS | Status: AC
  Filled 2014-08-28: qty 20

## 2014-08-28 MED ORDER — ASPIRIN EC 81 MG PO TBEC
81.0000 mg | DELAYED_RELEASE_TABLET | Freq: Every day | ORAL | Status: DC
  Administered 2014-08-29 – 2014-08-31 (×3): 81 mg via ORAL
  Filled 2014-08-28 (×4): qty 1

## 2014-08-28 MED ORDER — BUPIVACAINE-EPINEPHRINE (PF) 0.5% -1:200000 IJ SOLN
INTRAMUSCULAR | Status: AC
  Filled 2014-08-28: qty 30

## 2014-08-28 MED ORDER — ROCURONIUM BROMIDE 100 MG/10ML IV SOLN
INTRAVENOUS | Status: DC | PRN
  Administered 2014-08-28: 40 mg via INTRAVENOUS
  Administered 2014-08-28 (×2): 10 mg via INTRAVENOUS

## 2014-08-28 MED ORDER — FENTANYL CITRATE (PF) 250 MCG/5ML IJ SOLN
INTRAMUSCULAR | Status: AC
  Filled 2014-08-28: qty 5

## 2014-08-28 MED ORDER — EPHEDRINE SULFATE 50 MG/ML IJ SOLN
INTRAMUSCULAR | Status: AC
  Filled 2014-08-28: qty 1

## 2014-08-28 MED ORDER — BUPIVACAINE-EPINEPHRINE (PF) 0.5% -1:200000 IJ SOLN
INTRAMUSCULAR | Status: DC | PRN
  Administered 2014-08-28: 20 mL via PERINEURAL

## 2014-08-28 MED ORDER — ALVIMOPAN 12 MG PO CAPS
12.0000 mg | ORAL_CAPSULE | Freq: Once | ORAL | Status: AC
  Administered 2014-08-28: 12 mg via ORAL
  Filled 2014-08-28: qty 1

## 2014-08-28 MED ORDER — LACTATED RINGERS IR SOLN
Status: DC | PRN
  Administered 2014-08-28: 1

## 2014-08-28 MED ORDER — DEXAMETHASONE SODIUM PHOSPHATE 10 MG/ML IJ SOLN
INTRAMUSCULAR | Status: AC
  Filled 2014-08-28: qty 1

## 2014-08-28 MED ORDER — GLYCOPYRROLATE 0.2 MG/ML IJ SOLN
INTRAMUSCULAR | Status: DC | PRN
  Administered 2014-08-28: .8 mg via INTRAVENOUS

## 2014-08-28 MED ORDER — PROMETHAZINE HCL 25 MG/ML IJ SOLN: 6.2500 mg | INTRAMUSCULAR | Status: DC | PRN

## 2014-08-28 MED ORDER — GLYCOPYRROLATE 0.2 MG/ML IJ SOLN
INTRAMUSCULAR | Status: AC
  Filled 2014-08-28: qty 4

## 2014-08-28 MED ORDER — ADULT MULTIVITAMIN W/MINERALS CH
1.0000 | ORAL_TABLET | Freq: Every day | ORAL | Status: DC
  Administered 2014-08-28 – 2014-08-31 (×4): 1 via ORAL
  Filled 2014-08-28 (×5): qty 1

## 2014-08-28 MED ORDER — ENOXAPARIN SODIUM 40 MG/0.4ML ~~LOC~~ SOLN
40.0000 mg | SUBCUTANEOUS | Status: DC
  Administered 2014-08-29 – 2014-09-01 (×4): 40 mg via SUBCUTANEOUS
  Filled 2014-08-28 (×5): qty 0.4

## 2014-08-28 MED ORDER — HYDROMORPHONE HCL 1 MG/ML IJ SOLN
1.0000 mg | INTRAMUSCULAR | Status: DC | PRN
  Administered 2014-08-28 – 2014-08-29 (×3): 1 mg via INTRAVENOUS
  Filled 2014-08-28 (×3): qty 1

## 2014-08-28 MED ORDER — DEXTROSE 5 % IV SOLN
2.0000 g | Freq: Two times a day (BID) | INTRAVENOUS | Status: AC
  Administered 2014-08-28: 2 g via INTRAVENOUS
  Filled 2014-08-28: qty 2

## 2014-08-28 MED ORDER — ONDANSETRON HCL 4 MG/2ML IJ SOLN
INTRAMUSCULAR | Status: DC | PRN
  Administered 2014-08-28: 4 mg via INTRAVENOUS

## 2014-08-28 SURGICAL SUPPLY — 57 items
APPLIER CLIP 5 13 M/L LIGAMAX5 (MISCELLANEOUS)
APPLIER CLIP ROT 10 11.4 M/L (STAPLE)
BLADE EXTENDED COATED 6.5IN (ELECTRODE) ×2 IMPLANT
BLADE HEX COATED 2.75 (ELECTRODE) ×2 IMPLANT
CABLE HIGH FREQUENCY MONO STRZ (ELECTRODE) ×2 IMPLANT
CELLS DAT CNTRL 66122 CELL SVR (MISCELLANEOUS) IMPLANT
CLIP APPLIE 5 13 M/L LIGAMAX5 (MISCELLANEOUS) IMPLANT
CLIP APPLIE ROT 10 11.4 M/L (STAPLE) IMPLANT
DECANTER SPIKE VIAL GLASS SM (MISCELLANEOUS) IMPLANT
DRAPE LAPAROSCOPIC ABDOMINAL (DRAPES) ×2 IMPLANT
DRAPE UTILITY XL STRL (DRAPES) ×2 IMPLANT
DRSG OPSITE POSTOP 4X8 (GAUZE/BANDAGES/DRESSINGS) ×2 IMPLANT
ELECT REM PT RETURN 9FT ADLT (ELECTROSURGICAL) ×2
ELECTRODE REM PT RTRN 9FT ADLT (ELECTROSURGICAL) ×1 IMPLANT
GAUZE SPONGE 4X4 12PLY STRL (GAUZE/BANDAGES/DRESSINGS) IMPLANT
GLOVE BIO SURGEON STRL SZ 6.5 (GLOVE) ×6 IMPLANT
GLOVE BIOGEL PI IND STRL 7.0 (GLOVE) ×6 IMPLANT
GLOVE BIOGEL PI INDICATOR 7.0 (GLOVE) ×6
GLOVE EUDERMIC 7 POWDERFREE (GLOVE) ×8 IMPLANT
GOWN STRL REUS W/TWL XL LVL3 (GOWN DISPOSABLE) ×16 IMPLANT
LEGGING LITHOTOMY PAIR STRL (DRAPES) IMPLANT
LIGASURE IMPACT 36 18CM CVD LR (INSTRUMENTS) ×2 IMPLANT
NS IRRIG 1000ML POUR BTL (IV SOLUTION) ×4 IMPLANT
PACK COLON (CUSTOM PROCEDURE TRAY) ×2 IMPLANT
RELOAD PROXIMATE 75MM BLUE (ENDOMECHANICALS) ×4 IMPLANT
RTRCTR WOUND ALEXIS 18CM MED (MISCELLANEOUS)
SCISSORS LAP 5X35 DISP (ENDOMECHANICALS) ×2 IMPLANT
SET IRRIG TUBING LAPAROSCOPIC (IRRIGATION / IRRIGATOR) ×2 IMPLANT
SHEARS HARMONIC ACE PLUS 36CM (ENDOMECHANICALS) ×2 IMPLANT
SLEEVE XCEL OPT CAN 5 100 (ENDOMECHANICALS) ×4 IMPLANT
SOLUTION ANTI FOG 6CC (MISCELLANEOUS) ×2 IMPLANT
SPONGE LAP 18X18 X RAY DECT (DISPOSABLE) ×2 IMPLANT
STAPLER GUN LINEAR PROX 60 (STAPLE) ×2 IMPLANT
STAPLER PROXIMATE 75MM BLUE (STAPLE) ×2 IMPLANT
STAPLER VISISTAT 35W (STAPLE) ×2 IMPLANT
SUCTION POOLE TIP (SUCTIONS) ×2 IMPLANT
SUT PDS AB 1 CTX 36 (SUTURE) IMPLANT
SUT PDS AB 1 TP1 96 (SUTURE) IMPLANT
SUT PROLENE 2 0 KS (SUTURE) IMPLANT
SUT SILK 2 0 (SUTURE) ×1
SUT SILK 2 0 SH CR/8 (SUTURE) ×2 IMPLANT
SUT SILK 2-0 18XBRD TIE 12 (SUTURE) ×1 IMPLANT
SUT SILK 3 0 (SUTURE) ×1
SUT SILK 3 0 SH CR/8 (SUTURE) ×2 IMPLANT
SUT SILK 3-0 18XBRD TIE 12 (SUTURE) ×1 IMPLANT
SUT VIC AB 2-0 SH 18 (SUTURE) ×2 IMPLANT
SUT VICRYL 2 0 18  UND BR (SUTURE) ×2
SUT VICRYL 2 0 18 UND BR (SUTURE) ×2 IMPLANT
SYS LAPSCP GELPORT 120MM (MISCELLANEOUS)
SYSTEM LAPSCP GELPORT 120MM (MISCELLANEOUS) IMPLANT
TOWEL OR 17X26 10 PK STRL BLUE (TOWEL DISPOSABLE) ×2 IMPLANT
TOWEL OR NON WOVEN STRL DISP B (DISPOSABLE) ×2 IMPLANT
TRAY FOLEY CATH 16FR SILVER (SET/KITS/TRAYS/PACK) ×2 IMPLANT
TROCAR BLADELESS OPT 5 100 (ENDOMECHANICALS) ×2 IMPLANT
TROCAR XCEL BLUNT TIP 100MML (ENDOMECHANICALS) ×2 IMPLANT
TROCAR XCEL NON-BLD 11X100MML (ENDOMECHANICALS) IMPLANT
TUBING FILTER THERMOFLATOR (ELECTROSURGICAL) ×2 IMPLANT

## 2014-08-28 NOTE — Transfer of Care (Signed)
Immediate Anesthesia Transfer of Care Note  Patient: Edward Patterson  Procedure(s) Performed: Procedure(s): LAPAROSCOPIC ASSISTED right COLECTOMY,  (N/A)  Patient Location: PACU  Anesthesia Type:General  Level of Consciousness: awake, alert , oriented and patient cooperative  Airway & Oxygen Therapy: Patient Spontanous Breathing, Patient connected to nasal cannula oxygen and Patient connected to T-piece oxygen  Post-op Assessment: Report given to RN, Post -op Vital signs reviewed and stable and Patient moving all extremities  Post vital signs: Reviewed and stable  Last Vitals:  Filed Vitals:   08/28/14 0526  BP: 154/97  Pulse: 90  Temp: 36.7 C  Resp: 18    Complications: No apparent anesthesia complications

## 2014-08-28 NOTE — Care Management Note (Signed)
    Page 1 of 1   08/28/2014     3:01:34 PM CARE MANAGEMENT NOTE 08/28/2014  Patient:  Edward Patterson, Edward Patterson   Account Number:  [id number]  Date Initiated:  08/28/2014  Documentation initiated by:  Sunday Spillers  Subjective/Objective Assessment:   49 yo male admitted s/p Laparoscopic assisted right colectomy     Action/Plan:   Discharge planning   Anticipated DC Date:  08/31/2014   Anticipated DC Plan:  [place]  CM consult      Choice offered to / List presented to:             Status of service:  Completed, signed off Medicare Important Message given?   (If response is "NO", the following Medicare IM given date fields will be blank) Date Medicare IM given:   Medicare IM given by:   Date Additional Medicare IM given:   Additional Medicare IM given by:    Discharge Disposition:  HOME/SELF CARE  Per UR Regulation:  Reviewed for med. necessity/level of care/duration of stay  If discussed at [place] of Stay Meetings, dates discussed:    Comments:

## 2014-08-28 NOTE — Interval H&P Note (Signed)
History and Physical Interval Note:  08/28/2014 6:56 AM  Edward Patterson  has presented today for surgery, with the diagnosis of cancer ascending colon  The goals and the various methods of treatment have been discussed with the patient and family. After consideration of risks, benefits and other options for treatment, the patient has consented to  Procedure(s): LAPAROSCOPIC ASSISTED ASCENDING COLECTOMY, POSSIBLE OPEN (N/A) as a surgical intervention .  The patient's history has been reviewed, patient examined today,  no change in status, stable for surgery.  I have reviewed the patient's chart and labs.  Questions were answered to the patient's satisfaction.     Adin Hector

## 2014-08-28 NOTE — Anesthesia Preprocedure Evaluation (Addendum)
Anesthesia Evaluation  Patient identified by MRN, date of birth, ID band Patient awake    Reviewed: Allergy & Precautions, NPO status , Patient's Chart, lab work & pertinent test results, reviewed documented beta blocker date and time   Airway Mallampati: III  TM Distance: >3 FB Neck ROM: Full    Dental  (+) Teeth Intact, Dental Advisory Given   Pulmonary sleep apnea and Continuous Positive Airway Pressure Ventilation ,  breath sounds clear to auscultation        Cardiovascular hypertension, Pt. on medications and Pt. on home beta blockers Rhythm:Regular Rate:Normal     Neuro/Psych negative neurological ROS     GI/Hepatic negative GI ROS, Neg liver ROS,   Endo/Other  Morbid obesity  Renal/GU negative Renal ROS     Musculoskeletal   Abdominal   Peds  Hematology negative hematology ROS (+)   Anesthesia Other Findings   Reproductive/Obstetrics                            Anesthesia Physical  Anesthesia Plan  ASA: II  Anesthesia Plan: General   Post-op Pain Management:    Induction: Intravenous  Airway Management Planned: Oral ETT  Additional Equipment:   Intra-op Plan:   Post-operative Plan: Extubation in OR  Informed Consent: I have reviewed the patients History and Physical, chart, labs and discussed the procedure including the risks, benefits and alternatives for the proposed anesthesia with the patient or authorized representative who has indicated his/her understanding and acceptance.   Dental advisory given  Plan Discussed with: CRNA  Anesthesia Plan Comments:         Anesthesia Quick Evaluation

## 2014-08-28 NOTE — Progress Notes (Signed)
RT setup cpap on automode, minimum pressure 5cm-max 20cm h2o.  Pt brought in his tubing and nasal pillows and feels comfortable placing it on himself when ready for bed.  Pt was advised that RT is available all night should he need further assistance.  Sterile water already noted in humidity chamber.

## 2014-08-28 NOTE — Anesthesia Postprocedure Evaluation (Signed)
  Anesthesia Post-op Note  Patient: Edward Patterson  Procedure(s) Performed: Procedure(s) (LRB): LAPAROSCOPIC ASSISTED right COLECTOMY,  (N/A)  Patient Location: PACU  Anesthesia Type: General  Level of Consciousness: awake and alert   Airway and Oxygen Therapy: Patient Spontanous Breathing  Post-op Pain: mild  Post-op Assessment: Post-op Vital signs reviewed, Patient's Cardiovascular Status Stable, Respiratory Function Stable, Patent Airway and No signs of Nausea or vomiting  Last Vitals:  Filed Vitals:   08/28/14 1108  BP: 136/82  Pulse: 70  Temp: 37.2 C  Resp: 14    Post-op Vital Signs: stable   Complications: No apparent anesthesia complications

## 2014-08-28 NOTE — Anesthesia Procedure Notes (Signed)
Procedure Name: Intubation Date/Time: 08/28/2014 7:31 AM Performed by: Carleene Cooper A Pre-anesthesia Checklist: Patient identified, Timeout performed, Emergency Drugs available, Suction available and Patient being monitored Patient Re-evaluated:Patient Re-evaluated prior to inductionOxygen Delivery Method: Circle system utilized Preoxygenation: Pre-oxygenation with 100% oxygen Intubation Type: IV induction Ventilation: Oral airway inserted - appropriate to patient size and Mask ventilation without difficulty Laryngoscope Size: Mac and 4 Grade View: Grade I Tube type: Oral Tube size: 7.5 mm Number of attempts: 1 Airway Equipment and Method: Stylet Placement Confirmation: breath sounds checked- equal and bilateral and ETT inserted through vocal cords under direct vision Secured at: 21 cm Tube secured with: Tape Dental Injury: Teeth and Oropharynx as per pre-operative assessment

## 2014-08-28 NOTE — Op Note (Signed)
Patient Name:           Edward Patterson   Date of Surgery:        08/28/2014  Pre op Diagnosis:      Adenocarcinoma, hepatic flexure of colon  Post op Diagnosis:    Same  Procedure:                 Laparoscopic assisted right colectomy  Surgeon:                     Edsel Petrin. Dalbert Batman, M.D., FACS  Assistant:                      Leighton Ruff, M.D.  Operative Indications:    This is a 49 year old African-American man, referred by 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 [name] Is His PCP. The patient has no gastrointestinal symptoms. He underwent his first screening colonoscopy on June 03, 2014. A large polyp at the hepatic flexure was noted and biopsied and a 12 mm polyp in ascending colon was noted. Multiple polyp fragments showed adenomatous changes and some dysplasia. Biopsy of the hepatic flexure adenoma with ulceration. He was taken back for colonoscopic resection on February 29 but had an incomplete resection because the deeper layers were very dense. The partial resection showed invasive adenocarcinoma. He has had a CT scan which shows a mass near the hepatic flexure. Small benign-appearing lymph nodes. No metastasis. 2.4 cm hepatic hemangioma right lobe. He is aware of his diagnosis. Comorbidities include obesity with BMI of 37. Obstructive sleep apnea. Hypertension on 3 meds.  Family history reveals colon cancer in the sister, surgery last year, getting chemotherapy now. Mother had colon cancer and died of progressive disease. He underwent a bowel prep at home yesterday. He is brought to the operating room electively for laparoscopic assisted right colectomy   Operative Findings:       There was a palpable tumor about 3 cm in diameter within the tattooed area around the hepatic flexure. The lymph nodes did not appear palpably enlarged. There was no palpable evidence of metastatic disease in the liver. There  was a tiny cyst in the lateral right hepatic lobe. Terminal ileum and appendix and cecum looked normal. The gallbladder looked normal. Anastomosis was created between the terminal ileum and the mid transverse colon just distal to the middle colic vessels. I felt that I had at least 10 cm distal margin.  Procedure in Detail:          Following the induction of general endotracheal anesthesia a Foley catheter was placed. The abdomen was prepped and draped in a sterile fashion. Surgical timeout was performed. Intravenous antibiotics were given. 0.5% Marcaine with epinephrine was used as local infiltration anesthetic.    An 11 mm Hassan trocar was placed in the midline just above the umbilicus with an open technique. Entry was uneventful. Trocar was secured with Vicryl sutures. Pneumoperitoneum was created and video camera was inserted. Ultimately I placed 3 more 5 mm trochars, left midabdomen, right midabdomen, and lower midline. After surveying the abdomen and pelvis I swept the small bowel to the left and incised the peritoneum overlying the terminal ileum. There were some adhesions tethering the right colon to the anterior abdominal wall and  I took  these down. The cause of this was unclear. The appendix and cecum looked normal. I divided the lateral peritoneal attachments and  mobilized the terminal ileum and cecum and right colon and hepatic flexure medially. I then repositioned the patient and divided the gastrocolic omentum finding the dissection plane. Visualization was fairly good. I fully mobilized the transverse colon and hepatic flexure and mobilized everything medially pulling the hepatic flexure down off of the duodenum. Once I felt that I had adequate mobilization I made an extraction incision in the midline just a little bit above and a little bit below the umbilicus. The fascia was incised in the midline. A wound protector was placed. I eviscerated  the terminal ileum right colon and transverse  colon in to the wound. Terminal ileum was transected with the GIA stapling device about 6 inches proximal to the ileocecal valve. I cleaned off the mid transverse colon divided it with a GIA stapling device. I very carefully dissected the middle colic vessels and the right colic vessels back as far proximally as possible. These were clamped divided and doubly ligated with 2-0 silk ties. The thinner areas of mesentery were divided with the LigaSure device. The tumor was palpable. The specimen was sent to the lab with the history attached. Anastomosis was created between the terminal ileum and the mid transverse colon with a GIA stapling device. We had excellent blood supply. The common defect was closed with a TA 60 stapling device. A couple of staple line bleeders were controlled with figure-of-eight sutures of 3-0 silk. Staple line was reinforced at a couple of critical points with 3-0  stitches. I was very careful to align the mesentery so there was no twisting. This was checked one more time. There was no bleeding. I dropped the bowel back in the abdominal cavity and irrigated out with 2 L of saline. There did not appear to be any bleeding. The omentum was brought down in the midline,  fascia closed with a running suture of #1, double-stranded PDS. After irrigating the wound and the skin was closed with skin staples and Telfa wicks were placed between the staples. I reassessed insufflated the abdomen and inspected. Everything looked quiet, there was no bleeding. A little bit of irrigation fluid was removed. The pneumoperitoneum was released and the trochars were removed. Trocar sites were closed as well. Clean bandages were placed and the patient taken to PACU in stable condition. EBL 50 mL or less. Counts correct. Complications none.     Edsel Petrin. Dalbert Batman, M.D., FACS General and Minimally Invasive Surgery Breast and Colorectal Surgery  08/28/2014 9:49 AM

## 2014-08-29 LAB — CBC
HCT: 41.9 % (ref 39.0–52.0)
Hemoglobin: 13.8 g/dL (ref 13.0–17.0)
MCH: 28.3 pg (ref 26.0–34.0)
MCHC: 32.9 g/dL (ref 30.0–36.0)
MCV: 86 fL (ref 78.0–100.0)
Platelets: 192 K/uL (ref 150–400)
RBC: 4.87 MIL/uL (ref 4.22–5.81)
RDW: 13.4 % (ref 11.5–15.5)
WBC: 7.8 K/uL (ref 4.0–10.5)

## 2014-08-29 LAB — BASIC METABOLIC PANEL
Anion gap: 8 (ref 5–15)
BUN: 10 mg/dL (ref 6–23)
CALCIUM: 8.4 mg/dL (ref 8.4–10.5)
CHLORIDE: 102 mmol/L (ref 96–112)
CO2: 25 mmol/L (ref 19–32)
Creatinine, Ser: 1.01 mg/dL (ref 0.50–1.35)
GFR calc Af Amer: >90 mL/min (ref >90)
GFR, EST NON AFRICAN AMERICAN: 85 mL/min — AB (ref >90)
Glucose, Bld: 113 mg/dL — ABNORMAL HIGH (ref 70–99)
Potassium: 3 mmol/L — ABNORMAL LOW (ref 3.5–5.1)
SODIUM: 135 mmol/L (ref 135–145)

## 2014-08-29 MED ORDER — POTASSIUM CHLORIDE 10 MEQ/100ML IV SOLN
10.0000 meq | INTRAVENOUS | Status: AC
  Administered 2014-08-29 (×3): 10 meq via INTRAVENOUS
  Filled 2014-08-29 (×3): qty 100

## 2014-08-29 MED ORDER — POTASSIUM CHLORIDE IN NACL 40-0.9 MEQ/L-% IV SOLN
INTRAVENOUS | Status: DC
  Administered 2014-08-29 – 2014-08-30 (×2): 100 mL/h via INTRAVENOUS
  Administered 2014-08-31: 75 mL/h via INTRAVENOUS
  Filled 2014-08-29 (×5): qty 1000

## 2014-08-29 NOTE — Progress Notes (Signed)
1 Day Post-Op  Subjective: Some incisional soreness.  Voiding okay.  No nausea.  Has walked.  Objective: Vital signs in last 24 hours: Temp:  [97.8 F (36.6 C)-99.4 F (37.4 C)] 99.3 F (37.4 C) (04/16 0605) Pulse Rate:  [62-97] 79 (04/16 0605) Resp:  [13-18] 18 (04/16 0605) BP: (113-170)/(70-104) 113/75 mmHg (04/16 0605) SpO2:  [95 %-100 %] 100 % (04/16 0605) Last BM Date: 08/27/14  Intake/Output from previous day: 04/15 0701 - 04/16 0700 In: 5222.9 [I.V.:5172.9; IV Piggyback:50] Out: 2750 [Urine:2700; Blood:50] Intake/Output this shift:    PE: General- In NAD Abdomen-soft, some dried bloody drainage on dressing.  Lab Results:   Recent Labs  08/28/14 1236 08/29/14 0530  WBC 9.3 7.8  HGB 15.1 13.8  HCT 45.3 41.9  PLT 212 192   BMET  Recent Labs  08/28/14 1236 08/29/14 0530  NA  --  135  K  --  3.0*  CL  --  102  CO2  --  25  GLUCOSE  --  113*  BUN  --  10  CREATININE 1.06 1.01  CALCIUM  --  8.4   PT/INR No results for input(s): LABPROT, INR in the last 72 hours. Comprehensive Metabolic Panel:    Component Value Date/Time   NA 135 08/29/2014 0530   NA 137 08/19/2014 0830   K 3.0* 08/29/2014 0530   K 3.6 08/19/2014 0830   CL 102 08/29/2014 0530   CL 101 08/19/2014 0830   CO2 25 08/29/2014 0530   CO2 26 08/19/2014 0830   BUN 10 08/29/2014 0530   BUN 20 08/19/2014 0830   CREATININE 1.01 08/29/2014 0530   CREATININE 1.06 08/28/2014 1236   CREATININE 1.08 01/26/2014 0835   CREATININE 1.01 09/30/2012 1455   GLUCOSE 113* 08/29/2014 0530   GLUCOSE 98 08/19/2014 0830   CALCIUM 8.4 08/29/2014 0530   CALCIUM 9.2 08/19/2014 0830   AST 24 01/26/2014 0835   AST 22 09/30/2012 1455   ALT 31 01/26/2014 0835   ALT 26 09/30/2012 1455   ALKPHOS 69 01/26/2014 0835   ALKPHOS 75 09/30/2012 1455   BILITOT 0.6 01/26/2014 0835   BILITOT 0.7 09/30/2012 1455   PROT 6.9 01/26/2014 0835   PROT 7.0 09/30/2012 1455   ALBUMIN 4.5 01/26/2014 0835   ALBUMIN 4.4  09/30/2012 1455     Studies/Results: No results found.  Anti-infectives: Anti-infectives    Start     Dose/Rate Route Frequency Ordered Stop   08/28/14 2000  cefoTEtan (CEFOTAN) 2 g in dextrose 5 % 50 mL IVPB     2 g 100 mL/hr over 30 Minutes Intravenous Every 12 hours 08/28/14 1120 08/28/14 2019   08/28/14 0527  cefoTEtan (CEFOTAN) 2 g in dextrose 5 % 50 mL IVPB     2 g 100 mL/hr over 30 Minutes Intravenous On call to O.R. 08/28/14 0527 08/28/14 0730      Assessment Principal Problem:   Cancer of ascending colon s/p lap assisted right colectomy 08/28/14-did well overnight   HTN-BP wnl; home meds restarted.   Hypokalemia-potassium 3.0    LOS: 1 day   Plan: Clear liquid diet.  Correct hypokalemia.   Dorrine Montone J 08/29/2014

## 2014-08-30 LAB — BASIC METABOLIC PANEL
ANION GAP: 7 (ref 5–15)
BUN: 9 mg/dL (ref 6–23)
CO2: 25 mmol/L (ref 19–32)
CREATININE: 0.99 mg/dL (ref 0.50–1.35)
Calcium: 8.4 mg/dL (ref 8.4–10.5)
Chloride: 107 mmol/L (ref 96–112)
GFR calc Af Amer: >90 mL/min (ref >90)
GFR calc non Af Amer: >90 mL/min (ref >90)
GLUCOSE: 99 mg/dL (ref 70–99)
Potassium: 3.5 mmol/L (ref 3.5–5.1)
SODIUM: 139 mmol/L (ref 135–145)

## 2014-08-30 NOTE — Progress Notes (Signed)
2 Days Post-Op  Subjective: Tolerated clear liquids.  No flatus or BM.  Walking.  Objective: Vital signs in last 24 hours: Temp:  [98 F (36.7 C)-98.5 F (36.9 C)] 98.5 F (36.9 C) (04/17 0607) Pulse Rate:  [74-78] 74 (04/17 0607) Resp:  [18] 18 (04/17 0607) BP: (132-135)/(68-84) 132/68 mmHg (04/17 0607) SpO2:  [99 %-100 %] 100 % (04/17 0607) Last BM Date: 08/27/14  Intake/Output from previous day: 04/16 0701 - 04/17 0700 In: 2668.3 [I.V.:2668.3] Out: 4450 [Urine:4450] Intake/Output this shift:    PE: General- In NAD Abdomen-soft, some dried bloody drainage on dressing, hypoactive bowel sounds  Lab Results:   Recent Labs  08/28/14 1236 08/29/14 0530  WBC 9.3 7.8  HGB 15.1 13.8  HCT 45.3 41.9  PLT 212 192   BMET  Recent Labs  08/29/14 0530 08/30/14 0522  NA 135 139  K 3.0* 3.5  CL 102 107  CO2 25 25  GLUCOSE 113* 99  BUN 10 9  CREATININE 1.01 0.99  CALCIUM 8.4 8.4   PT/INR No results for input(s): LABPROT, INR in the last 72 hours. Comprehensive Metabolic Panel:    Component Value Date/Time   NA 139 08/30/2014 0522   NA 135 08/29/2014 0530   K 3.5 08/30/2014 0522   K 3.0* 08/29/2014 0530   CL 107 08/30/2014 0522   CL 102 08/29/2014 0530   CO2 25 08/30/2014 0522   CO2 25 08/29/2014 0530   BUN 9 08/30/2014 0522   BUN 10 08/29/2014 0530   CREATININE 0.99 08/30/2014 0522   CREATININE 1.01 08/29/2014 0530   CREATININE 1.08 01/26/2014 0835   CREATININE 1.01 09/30/2012 1455   GLUCOSE 99 08/30/2014 0522   GLUCOSE 113* 08/29/2014 0530   CALCIUM 8.4 08/30/2014 0522   CALCIUM 8.4 08/29/2014 0530   AST 24 01/26/2014 0835   AST 22 09/30/2012 1455   ALT 31 01/26/2014 0835   ALT 26 09/30/2012 1455   ALKPHOS 69 01/26/2014 0835   ALKPHOS 75 09/30/2012 1455   BILITOT 0.6 01/26/2014 0835   BILITOT 0.7 09/30/2012 1455   PROT 6.9 01/26/2014 0835   PROT 7.0 09/30/2012 1455   ALBUMIN 4.5 01/26/2014 0835   ALBUMIN 4.4 09/30/2012 1455      Studies/Results: No results found.  Anti-infectives: Anti-infectives    Start     Dose/Rate Route Frequency Ordered Stop   08/28/14 2000  cefoTEtan (CEFOTAN) 2 g in dextrose 5 % 50 mL IVPB     2 g 100 mL/hr over 30 Minutes Intravenous Every 12 hours 08/28/14 1120 08/28/14 2019   08/28/14 0527  cefoTEtan (CEFOTAN) 2 g in dextrose 5 % 50 mL IVPB     2 g 100 mL/hr over 30 Minutes Intravenous On call to O.R. 08/28/14 0527 08/28/14 0730      Assessment Principal Problem:   Cancer of ascending colon s/p lap assisted right colectomy 08/28/14-no bowel activity yet   HTN-BP wnl; home meds restarted.   Hypokalemia-corrected.    LOS: 2 days   Plan: Full liquids.   Leia Coletti J 08/30/2014

## 2014-08-31 ENCOUNTER — Encounter (HOSPITAL_COMMUNITY): Payer: Self-pay | Admitting: General Surgery

## 2014-08-31 MED ORDER — HYDROMORPHONE HCL 1 MG/ML IJ SOLN: 1.0000 mg | INTRAMUSCULAR | Status: DC | PRN

## 2014-08-31 NOTE — Progress Notes (Addendum)
3 Days Post-Op  Subjective: Seems to be doing well. In good spirits. Had 2 small bowel movements. Tolerating full liquids and says he is hungry. Ambulating in halls. Voiding uneventfully. Pain well controlled. Pathology report pending. I told him that hopefully we can discuss the pathology report tomorrow and hopefully he can go home tomorrow. I told him I would refer him to a medical oncologist and possible genetic testing.  Objective: Vital signs in last 24 hours: Temp:  [98.3 F (36.8 C)-98.8 F (37.1 C)] 98.3 F (36.8 C) (04/18 0517) Pulse Rate:  [64-79] 64 (04/18 0517) Resp:  [18] 18 (04/18 0517) BP: (129-135)/(82-85) 131/82 mmHg (04/18 0517) SpO2:  [100 %] 100 % (04/18 0517) Last BM Date: 08/30/14  Intake/Output from previous day: 04/17 0701 - 04/18 0700 In: 1617.9 [I.V.:1617.9] Out: 1175 [Urine:1175] Intake/Output this shift: Total I/O In: 900 [I.V.:900] Out: 875 [Urine:875]  General appearance: Alert. Pleasant. No distress.  Mental status normal. Resp: clear to auscultation bilaterally GI: Abdomen soft. Nondistended. Active bowel sounds. Minimal tenderness. Incisions look good. Honeycomb dressing removed. Telfa wicks removed. Redressed.  Lab Results:  No results found for this or any previous visit (from the past 24 hour(s)).   Studies/Results: No results found.  Marland Kitchen aspirin EC  81 mg Oral Daily  . carvedilol  12.5 mg Oral BID WC  . chlorthalidone  25 mg Oral Daily  . enoxaparin (LOVENOX) injection  40 mg Subcutaneous Q24H  . lisinopril  20 mg Oral Daily  . multivitamin with minerals  1 tablet Oral Daily     Assessment/Plan: s/p Procedure(s): LAPAROSCOPIC ASSISTED right COLECTOMY,    POD #3. Laparoscopic assisted right colectomy. Doing well Soft diet Decrease IV. Check pathology Home tomorrow Outpatient medical oncology referral  Family history of colon cancer-mother and sister  BMI 37 Sleep apnea in adult. Continue CPAP Hypertension. Controlled.  Continue home meds. Hypokalemia-resolved  @PROBHOSP @  LOS: 3 days    Deannie Resetar M 08/31/2014  . .prob

## 2014-09-01 MED ORDER — OXYCODONE-ACETAMINOPHEN 5-325 MG PO TABS: 1.0000 | ORAL_TABLET | ORAL | Status: DC | PRN

## 2014-09-01 NOTE — Progress Notes (Signed)
RT checked with patient about CPAP. RT added sterile water to chamber for humidification. Setting is Auto 5-20 cmH2O. Patient stated that he would self administer when he was ready to go to sleep. RT will monitor as needed.

## 2014-09-01 NOTE — Discharge Summary (Addendum)
Patient ID: Edward Patterson [id number] 49 y.o. April 11, 1966  Admit date: 08/28/2014  Discharge date and time: 09/01/2014  Admitting Physician: Adin Hector  Discharge Physician: Adin Hector  Admission Diagnoses: Adenocarcinoma of ascending colon, stage T3, N0                                         Family history of colon cancer-mother and sister                                         BMI 37                                         Sleep apnea in adult.                                          Hypertension. .                                         Hypokalemia-resolved  Discharge Diagnoses: same  Operations: Procedure(s): LAPAROSCOPIC ASSISTED right COLECTOMY,   Admission Condition: good  Discharged Condition: good  Indication for Admission: This is a 49 year old African-American man, referred by 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 [name] Is His PCP. The patient has no gastrointestinal symptoms. He underwent his first screening colonoscopy on June 03, 2014. A large polyp at the hepatic flexure was noted and biopsied and a 12 mm polyp in ascending colon was noted. Multiple polyp fragments showed adenomatous changes and some dysplasia. Biopsy of the hepatic flexure adenoma with ulceration. He was taken back for colonoscopic resection on February 29 but had an incomplete resection because the deeper layers were very dense. The partial resection showed invasive adenocarcinoma. He has had a CT scan which shows a mass near the hepatic flexure. Small benign-appearing lymph nodes. No metastasis. 2.4 cm hepatic hemangioma right lobe. He is aware of his diagnosis. Comorbidities include obesity with BMI of 37. Obstructive sleep apnea. Hypertension on 3 meds.  Family history reveals colon cancer in the sister, surgery last year, getting chemotherapy now. Mother had colon cancer and died of progressive  disease. He underwent a bowel prep at home preop.   Marland Kitchen He is brought to the operating room electively for laparoscopic assisted right colectomy   Hospital Course: On the day of admission the patient was taken to the operating room and underwent a laparoscopic-assisted right colectomy. The surgery seemed to be relatively uneventful.    There was a palpable tumor about 3 cm in diameter within the tattooed area around the hepatic flexure. The lymph nodes did not appear palpably enlarged. There was no palpable evidence of metastatic disease in the liver. There was a tiny cyst in the lateral right hepatic lobe. Terminal ileum and appendix and cecum looked normal. The gallbladder looked normal. Anastomosis was created between the terminal ileum and the mid transverse colon just distal to the middle colic vessels. I felt that  I had at least 10 cm distal margin.     Final pathology report showed a 3.7 cm invasive adenocarcinoma which had invaded through the wall of the colon. 20 lymph nodes were examined and all were negative for metastatic disease. Final pathologic stage T3, N0. I discussed the pathology report with the patient. Advised him to undergo consultation with the medical oncologist as an outpatient and  he is in full agreement with that.     Postoperatively the patient did well. He advanced in his diet and activities without any difficulty. By the  fourth  postoperative day he had been having bowel movements and passing flatus on several occasions. He was tolerating a solid diet without nausea or pain. His wound looked good. Staples intact and no signs of infection. He was fully ambulatory pain control is very good. He was given a prescription for Percocet for pain. He was told to continue his usual medications. He was given formal education and high-fiber, low-fat diet. Diet and activities were discussed. He was to return to see me in the office in one week for wound check and staple removal. I told him we  would arrange for referral to medical oncologist as an outpatient. He understands restrictions on work, lifting, and sports.    Lab work on April 16 revealed a hemoglobin 13.8, WBC 7800, glucose 113, creatinine 1.01. BUN 10.    Because of his strong family history of colon cancer, I told him that consideration would be given to genetic counseling and genetic testing. I told him that would be taken care of at the cancer center.      Consults: None  Significant Diagnostic Studies: Surgical pathology  Treatments: surgery: Laparoscopic assisted right colectomy  Disposition: Home  Patient Instructions:    Medication List    TAKE these medications        aspirin 81 MG tablet  Take 81 mg by mouth every morning.     carvedilol 12.5 MG tablet  Commonly known as:  COREG  Take 1 tablet (12.5 mg total) by mouth 2 (two) times daily with a meal.     chlorthalidone 25 MG tablet  Commonly known as:  HYGROTON  Take 1 tablet (25 mg total) by mouth daily.     FISH OIL PO  Take 1 capsule by mouth daily at 3 pm.     lisinopril 20 MG tablet  Commonly known as:  PRINIVIL,ZESTRIL  TAKE 1 TABLET BY MOUTH DAILY     multivitamin capsule  Take 1 capsule by mouth every morning.     oxyCODONE-acetaminophen 5-325 MG per tablet  Commonly known as:  PERCOCET/ROXICET  Take 1-2 tablets by mouth every 4 (four) hours as needed for moderate pain.        Activity: No sports or heavy lifting for 5 weeks. No driving a car for 1-2 weeks. Lots of ambulation. Diet: Low-fat, low-cholesterol, high-fiber diet Wound Care: Change dry gauze bandage daily. Okay to shower.  Follow-up:  With Dr. Dalbert Batman in 1 week.  Signed: Edsel Petrin. Dalbert Batman, M.D., FACS General and minimally invasive surgery Breast and Colorectal Surgery  09/01/2014, 6:48 AM

## 2014-09-01 NOTE — Discharge Instructions (Signed)
-  see above 

## 2014-09-02 ENCOUNTER — Telehealth: Payer: Self-pay | Admitting: Oncology

## 2014-09-02 NOTE — Telephone Encounter (Signed)
NP- s/w patient and gave appt for 05/05 @ 1:30 w/Dr. Benay Spice

## 2014-09-02 NOTE — Telephone Encounter (Signed)
new patient referral-left message for patient to return call to schedule np appt °

## 2014-09-15 ENCOUNTER — Other Ambulatory Visit: Payer: Self-pay | Admitting: Medical

## 2014-09-17 ENCOUNTER — Ambulatory Visit: Payer: BC Managed Care – PPO | Admitting: Oncology

## 2014-09-17 ENCOUNTER — Ambulatory Visit: Payer: BC Managed Care – PPO

## 2014-09-17 ENCOUNTER — Telehealth: Payer: Self-pay | Admitting: *Deleted

## 2014-09-17 NOTE — Telephone Encounter (Signed)
No show for his appointment today. Called and left VM to call office to reschedule. Will notify referring provider.

## 2014-09-17 NOTE — Telephone Encounter (Signed)
Patient called to report that he has decided against seeing a medical oncologist. Does not think he needs to see one. Explained that his mass was T3 and may benefit him to talk with medical oncology to determine if chemotherapy tx is of any benefit in his long term survival. He said "I'm fine. They got it all".

## 2015-01-20 ENCOUNTER — Other Ambulatory Visit: Payer: Self-pay | Admitting: Medical

## 2015-01-20 ENCOUNTER — Telehealth: Payer: Self-pay | Admitting: Medical

## 2015-01-20 MED ORDER — CARVEDILOL 12.5 MG PO TABS: 12.5000 mg | ORAL_TABLET | Freq: Two times a day (BID) | ORAL | Status: DC

## 2015-01-20 MED ORDER — LISINOPRIL 20 MG PO TABS: 20.0000 mg | ORAL_TABLET | Freq: Every day | ORAL | Status: DC

## 2015-01-20 NOTE — Telephone Encounter (Signed)
pls get him back for yearly physical.  Due back at this time

## 2015-01-21 NOTE — Telephone Encounter (Signed)
Pt scheduled a cpe later in September.

## 2015-02-02 ENCOUNTER — Other Ambulatory Visit: Payer: Self-pay | Admitting: Medical

## 2015-02-04 ENCOUNTER — Encounter: Payer: Self-pay | Admitting: Medical

## 2015-02-04 ENCOUNTER — Ambulatory Visit (INDEPENDENT_AMBULATORY_CARE_PROVIDER_SITE_OTHER): Payer: BC Managed Care – PPO | Admitting: Medical

## 2015-02-04 VITALS — BP 180/100 | HR 64 | Temp 97.8°F | Resp 18 | Ht 71.0 in | Wt 257.0 lb

## 2015-02-04 DIAGNOSIS — G4733 Obstructive sleep apnea (adult) (pediatric): Secondary | ICD-10-CM | POA: Diagnosis not present

## 2015-02-04 DIAGNOSIS — Z0000 Encounter for general adult medical examination without abnormal findings: Secondary | ICD-10-CM | POA: Diagnosis not present

## 2015-02-04 DIAGNOSIS — C182 Malignant neoplasm of ascending colon: Secondary | ICD-10-CM | POA: Diagnosis not present

## 2015-02-04 DIAGNOSIS — E669 Obesity, unspecified: Secondary | ICD-10-CM

## 2015-02-04 DIAGNOSIS — I10 Essential (primary) hypertension: Secondary | ICD-10-CM

## 2015-02-04 DIAGNOSIS — Z85038 Personal history of other malignant neoplasm of large intestine: Secondary | ICD-10-CM

## 2015-02-04 DIAGNOSIS — Z23 Encounter for immunization: Secondary | ICD-10-CM | POA: Insufficient documentation

## 2015-02-04 DIAGNOSIS — Z800 Family history of malignant neoplasm of digestive organs: Secondary | ICD-10-CM

## 2015-02-04 DIAGNOSIS — Z125 Encounter for screening for malignant neoplasm of prostate: Secondary | ICD-10-CM | POA: Diagnosis not present

## 2015-02-04 LAB — CBC
HEMATOCRIT: 48.2 % (ref 39.0–52.0)
Hemoglobin: 16 g/dL (ref 13.0–17.0)
MCH: 27.9 pg (ref 26.0–34.0)
MCHC: 33.2 g/dL (ref 30.0–36.0)
MCV: 84.1 fL (ref 78.0–100.0)
MPV: 10.4 fL (ref 8.6–12.4)
Platelets: 217 10*3/uL (ref 150–400)
RBC: 5.73 MIL/uL (ref 4.22–5.81)
RDW: 15.3 % (ref 11.5–15.5)
WBC: 3.8 10*3/uL — ABNORMAL LOW (ref 4.0–10.5)

## 2015-02-04 MED ORDER — CARVEDILOL 12.5 MG PO TABS: 12.5000 mg | ORAL_TABLET | Freq: Two times a day (BID) | ORAL | Status: DC

## 2015-02-04 MED ORDER — ASPIRIN 81 MG PO TABS
81.0000 mg | ORAL_TABLET | Freq: Every day | ORAL | Status: DC
Start: 2015-02-04 — End: 2016-07-17

## 2015-02-04 MED ORDER — LISINOPRIL-HYDROCHLOROTHIAZIDE 20-12.5 MG PO TABS: 1.0000 | ORAL_TABLET | Freq: Every day | ORAL | Status: DC

## 2015-02-04 MED ORDER — PRAVASTATIN SODIUM 20 MG PO TABS: 20.0000 mg | ORAL_TABLET | Freq: Every day | ORAL | Status: DC

## 2015-02-04 NOTE — Patient Instructions (Signed)
  Thank you for giving me the opportunity to serve you today.    Your diagnosis today includes: Encounter Diagnoses  Name Primary?  . Encounter for health maintenance examination in adult Yes  . Cancer of ascending colon s/p lap assisted right colectomy 08/28/14   . History of colon cancer, stage III   . Family history of colon cancer   . Essential hypertension   . Obesity   . OSA (obstructive sleep apnea)   . Need for prophylactic vaccination and inoculation against influenza   . Screening for prostate cancer   . Need for Tdap vaccination     Recommendations:  I recommend you have consult with oncology in the early part of 2017  I recommend you younger brother and sister have a colonoscopy now since they are both over 41 years old given you, your sister and mother's history of colon cancer.  Likewise, your daughter should have a colonoscopy no later than age 43 years old  Continue Coreg twice daily for blood pressure  STOP Lisinopril  Begin Lisinopril HCT for blood pressure daily in the morning  continue Aspirin 81 mg daily at bedtime for heart health  Plan on starting Pravachol 20 mg daily at bedtime for heart health and cholesterol.   We will confirm this when I get the labs back  Work on losing weight, less calories in the diet  Exercise regularly  We updated your influenza and Tdap vaccine today.  Continue CPAP daily  We will call with lab results.

## 2015-02-04 NOTE — Progress Notes (Signed)
Subjective:   HPI  Edward Patterson is a 49 y.o. male who presents for a complete physical.  Medical care team includes: Dorothea Ogle PA-C here for primary care [name], GI Dr. Fanny Skates, general surgery 2016 for colectomy   Preventative care:done here Last ophthalmology Gideon dental visit:tbs, [place] office Last colonoscopy:2015 [place] Last prostate exam: 2015  Last NWG:NFAOZ ago at hospital Last labs:2015  Prior vaccinations: TD or Tdap:unsure Influenza:today  Advanced directive:no Health care power of attorney:no Living will:no  Concerns:none Apparently not taking chlorthalidone started last physical, but is compliant with lisinopril, Coreg, CPAP.   Exercising, trying to eat healthy.     Reviewed their medical, surgical, family, social, medication, and allergy history and updated chart as appropriate.  Past Medical History  Diagnosis Date  . Hypertension   . Obstructive sleep apnea on CPAP     CPAP  . Allergy   . Obesity   . Colon cancer 08/2014    Stage TIII, N0, invasive adenocarcinoma of hepatic flexure/right colon s/p resection but declined medical oncology f/u  . Family history of colon cancer     mother, sister    Past Surgical History  Procedure Laterality Date  . Wisdom tooth extraction    . Colonoscopy with propofol N/A 07/13/2014    Procedure: COLONOSCOPY WITH PROPOFOL;  Surgeon: Inda Castle, MD;  Location: [facility];  Service: Endoscopy;  Laterality: N/A;  . Hot hemostasis N/A 07/13/2014    Procedure: HOT HEMOSTASIS (ARGON PLASMA COAGULATION/BICAP);  Surgeon: Inda Castle, MD;  Location: [facility];  Service: Endoscopy;  Laterality: N/A;  . Laparoscopic partial colectomy N/A 08/28/2014    Procedure: LAPAROSCOPIC ASSISTED right COLECTOMY, ;  Surgeon: Fanny Skates, MD;  Location: [facility];  Service: General;  Laterality: N/A;  . Appendectomy  08/2014    along with right colectomy     Social History   Social History  . Marital Status: Married    Spouse Name: N/A  . Number of Children: N/A  . Years of Education: N/A   Occupational History  . Not on file.   Social History Main Topics  . Smoking status: Never Smoker   . Smokeless tobacco: Never Used  . Alcohol Use: 4.8 oz/week    5 Cans of beer, 3 Shots of liquor per week     Comment: weekends social  . Drug Use: No  . Sexual Activity: Not on file     Comment: single, engaged, 1 child, exercise 3-4 days/wk, weights, walks a lot at work, works in housekeeping at [employer]; prior surgical tech experience   Other Topics Concern  . Not on file   Social History Narrative   Married, 25yo child, exercise -  physically active on the job.   Housekeeping [employer]. Sometimes uses the gym at [place] History  Problem Relation Age of Onset  . Hypertension Mother   . Cancer Mother     colon  . Diabetes Maternal Aunt   . Heart disease Neg Hx   . Stroke Neg Hx   . Hyperlipidemia Neg Hx   . Colon cancer Neg Hx   . Esophageal cancer Neg Hx   . Rectal cancer Neg Hx   . Stomach cancer Neg Hx   . Cancer Sister     sister  . Edema Sister      Current outpatient prescriptions:  .  aspirin 81 MG tablet, Take 81 mg by  mouth every morning. , Disp: , Rfl:  .  carvedilol (COREG) 12.5 MG tablet, Take 1 tablet (12.5 mg total) by mouth 2 (two) times daily with a meal., Disp: 180 tablet, Rfl: 0 .  lisinopril (PRINIVIL,ZESTRIL) 20 MG tablet, Take 1 tablet (20 mg total) by mouth daily., Disp: 90 tablet, Rfl: 0 .  Multiple Vitamin (MULTIVITAMIN) capsule, Take 1 capsule by mouth every morning. , Disp: , Rfl:  .  Omega-3 Fatty Acids (FISH OIL PO), Take 1 capsule by mouth daily at 3 pm. , Disp: , Rfl:  .  chlorthalidone (HYGROTON) 25 MG tablet, Take 1 tablet (25 mg total) by mouth daily. (Patient not taking: Reported on 02/04/2015), Disp: 30 tablet, Rfl: 1 .  oxyCODONE-acetaminophen (PERCOCET/ROXICET) 5-325 MG per  tablet, Take 1-2 tablets by mouth every 4 (four) hours as needed for moderate pain. (Patient not taking: Reported on 02/04/2015), Disp: 30 tablet, Rfl: 0  No Known Allergies   Review of Systems Constitutional: -fever, -chills, -sweats, -unexpected weight change, -decreased appetite, -fatigue Allergy: -sneezing, -itching, -congestion Dermatology: -changing moles, --rash, -lumps ENT: -runny nose, -ear pain, -sore throat, -hoarseness, -sinus pain, -teeth pain, - ringing in ears, -hearing loss, -nosebleeds Cardiology: -chest pain, -palpitations, -swelling, -difficulty breathing when lying flat, -waking up short of breath Respiratory: -cough, -shortness of breath, -difficulty breathing with exercise or exertion, -wheezing, -coughing up blood Gastroenterology: -abdominal pain, -nausea, -vomiting, -diarrhea, -constipation, -blood in stool, -changes in bowel movement, -difficulty swallowing or eating Hematology: -bleeding, -bruising  Musculoskeletal: -joint aches, -muscle aches, -joint swelling, -back pain, -neck pain, -cramping, -changes in gait Ophthalmology: denies vision changes, eye redness, itching, discharge Urology: -burning with urination, -difficulty urinating, -blood in urine, -urinary frequency, -urgency, -incontinence Neurology: -headache, -weakness, -tingling, -numbness, -memory loss, -falls, -dizziness Psychology: -depressed mood, -agitation, -sleep problems     Objective:   Physical Exam  BP 180/100 mmHg  Pulse 64  Temp(Src) 97.8 F (36.6 C)  Resp 18  Ht 5\' 11"  (1.803 m)  Wt 257 lb (116.574 kg)  BMI 35.86 kg/m2  General appearance: alert, no distress, WD/WN, obese AA male  Skin: scattered pedunculated brown well defined lesions of bilat orbits and neck and upper back, suggestive of skin tags, left lateral upper arm and forearm with 2 patches of rectangular scar, central chest with horizontal 2cm scar/keloid, right parietal/frontal scalp with 3cm linear scar/keloid slightly  raised HEENT: normocephalic, conjunctiva/corneas normal, sclerae anicteric, PERRLA, EOMi, nares patent, no discharge or erythema, pharynx normal  Oral cavity: MMM, tongue normal, teeth in good repair but some gingival inflammation Neck: supple, no lymphadenopathy, no thyromegaly, no masses, normal ROM, no bruits  Chest: non tender, normal shape and expansion  Heart: RRR, normal S1, S2, no murmurs  Lungs: CTA bilaterally, no wheezes, rhonchi, or rales  Abdomen: +bs, soft, vertical surgical scar of lower abdomen, otherwise non tender, non distended, no masses, no hepatomegaly, no splenomegaly, no bruits  Back: non tender, normal ROM, no scoliosis  Musculoskeletal: upper extremities non tender, no obvious deformity, normal ROM throughout, lower extremities non tender, no obvious deformity, normal ROM throughout  Extremities: no edema, no cyanosis, no clubbing  Pulses: 2+ symmetric, upper and lower extremities, normal cap refill  Neurological: alert, oriented x 3, CN2-12 intact, strength normal upper extremities and lower extremities, sensation normal throughout, DTRs 2+ throughout, no cerebellar signs, gait normal  Psychiatric: normal affect, behavior normal, pleasant  GU: normal male external genitalia, circumcised, nontender, no masses, no hernia, no lymphadenopathy  Rectal: anus normal tone, prostate WNL, can't  completely palpate entire prostate given body habitus   Assessment and Plan :    Encounter Diagnoses  Name Primary?  . Encounter for health maintenance examination in adult Yes  . Cancer of ascending colon s/p lap assisted right colectomy 08/28/14   . History of colon cancer, stage III   . Family history of colon cancer   . Essential hypertension   . Obesity   . OSA (obstructive sleep apnea)   . Need for prophylactic vaccination and inoculation against influenza   . Screening for prostate cancer   . Need for Tdap vaccination     Physical exam - discussed healthy  lifestyle, diet, exercise, preventative care, vaccinations, and addressed their concerns.   See your eye doctor yearly for routine vision care. See your dentist yearly for routine dental care including hygiene visits twice yearly. Colon cancer - s/p right colectomy and appendectomy.   Advised he have the medical oncology visit he declined earlier this year.  He will do after first of 2017.  Also advised he get his younger brother and sister, both in their 68s to go have colonoscopy. Advised him to have daughter get colonoscopy by age 78yo unless GI or oncology tells him otherwise.   Overall he is doing well, but stressed need for oncology consult for additional recommendations and surveillance.  HTN - advised he not stop medication or run out, and don't stop medication for physical visits.   Discussed possible complications of HTN, compliance, f/u Obesity - work on lifestyle changes to lose weight OSA - c/t CPAP Counseled on the influenza virus vaccine.  Vaccine information sheet given.  Influenza vaccine given after consent obtained. Counseled on the Tdap (tetanus, diptheria, and acellular pertussis) vaccine.  Vaccine information sheet given. Tdap vaccine given after consent obtained. PSA prostate cancer screen today Follow-up pending labs

## 2015-02-05 LAB — COMPREHENSIVE METABOLIC PANEL
ALK PHOS: 80 U/L (ref 40–115)
ALT: 28 U/L (ref 9–46)
AST: 25 U/L (ref 10–35)
Albumin: 4 g/dL (ref 3.6–5.1)
BUN: 14 mg/dL (ref 7–25)
CO2: 27 mmol/L (ref 20–31)
Calcium: 9.2 mg/dL (ref 8.6–10.3)
Chloride: 101 mmol/L (ref 98–110)
Creat: 0.84 mg/dL (ref 0.70–1.33)
GLUCOSE: 80 mg/dL (ref 65–99)
Potassium: 3.8 mmol/L (ref 3.5–5.3)
Sodium: 139 mmol/L (ref 135–146)
Total Bilirubin: 1 mg/dL (ref 0.2–1.2)
Total Protein: 6.7 g/dL (ref 6.1–8.1)

## 2015-02-05 LAB — LIPID PANEL
Cholesterol: 150 mg/dL (ref 125–200)
HDL: 41 mg/dL (ref >=40)
LDL Cholesterol: 79 mg/dL (ref <130)
Total CHOL/HDL Ratio: 3.7 Ratio (ref <=5.0)
Triglycerides: 148 mg/dL (ref <150)
VLDL: 30 mg/dL (ref <30)

## 2015-02-05 LAB — HEMOGLOBIN A1C
Hgb A1c MFr Bld: 6 % — ABNORMAL HIGH (ref <5.7)
Mean Plasma Glucose: 126 mg/dL — ABNORMAL HIGH (ref <117)

## 2015-02-05 LAB — MICROALBUMIN / CREATININE URINE RATIO
Creatinine, Urine: 164.4 mg/dL
Microalb Creat Ratio: 6.7 mg/g (ref 0.0–30.0)
Microalb, Ur: 1.1 mg/dL (ref <2.0)

## 2015-02-05 LAB — TSH: TSH: 1.5 u[IU]/mL (ref 0.350–4.500)

## 2015-02-05 LAB — PSA: PSA: 0.97 ng/mL (ref <=4.00)

## 2015-03-17 ENCOUNTER — Other Ambulatory Visit: Payer: BC Managed Care – PPO

## 2015-04-27 ENCOUNTER — Other Ambulatory Visit: Payer: Self-pay | Admitting: Medical

## 2015-04-30 ENCOUNTER — Other Ambulatory Visit: Payer: Self-pay | Admitting: Medical

## 2015-07-07 ENCOUNTER — Encounter: Payer: Self-pay | Admitting: Gastroenterology

## 2015-07-22 ENCOUNTER — Telehealth: Payer: Self-pay | Admitting: Gastroenterology

## 2015-07-22 NOTE — Telephone Encounter (Signed)
Understood. Sounds like we have given him the recommendation and referred him to the appropriate resources.

## 2015-07-22 NOTE — Telephone Encounter (Signed)
Per Dr Deatra Ina:  Biopsies demonstrated invasive cancer. Patient is already referred to surgery. Please schedule a CT of the chest abdomen and pelvis. Colonoscopy one year I will notify the patient   The pt states he has a large bill with this office and he prefers to take care of that before any further procedures.  I did inform him of the importance of having the colonoscopy to evaluate for any cancer or other abnormalities.  He states he understands but still prefers to call back at a later date to set up the procedure.  I informed him of patient assistance programs with [facility] and offered to have him schedule and appt with Dr Loletha Carrow but he declined. FYI:  Dr Loletha Carrow

## 2015-08-20 ENCOUNTER — Telehealth: Payer: Self-pay | Admitting: Family Medicine

## 2015-08-20 MED ORDER — CARVEDILOL 12.5 MG PO TABS: 12.5000 mg | ORAL_TABLET | Freq: Two times a day (BID) | ORAL | Status: DC

## 2015-08-20 MED ORDER — LISINOPRIL-HYDROCHLOROTHIAZIDE 20-12.5 MG PO TABS: 1.0000 | ORAL_TABLET | Freq: Every day | ORAL | Status: DC

## 2015-08-20 NOTE — Telephone Encounter (Signed)
Recvd refill request for Lisinopril 20-12.5 mg and Carvedilol 12.5 mg to [facility] at [facility]

## 2015-08-20 NOTE — Telephone Encounter (Signed)
done

## 2015-10-04 ENCOUNTER — Telehealth: Payer: Self-pay | Admitting: Medical

## 2015-10-04 NOTE — Telephone Encounter (Signed)
Pt called and requested that his immunizations records be sent over to the employee health faxed them to [phone]

## 2015-10-05 IMAGING — CT CT CHEST W/ CM
1 of 3 series · 14 of 32 positions shown, 18 images · IV contrast (omnipaque)
Comparison: None.

CLINICAL DATA: Newly diagnosed colon mass on routine screening.
Invasive adenocarcinoma arising in a tubulovillous adenoma in the
ascending colon on biopsy. Initial encounter.

EXAM:
CT CHEST, ABDOMEN, AND PELVIS WITH CONTRAST
TECHNIQUE: Multidetector CT imaging of the chest, abdomen and pelvis was
performed following the standard protocol during bolus
administration of intravenous contrast.
CONTRAST:  100mL OMNIPAQUE IOHEXOL 300 MG/ML  SOLN

[Series 2: cap with · axial · 0.79mm/px · z∈[-630,-56]mm · 14 of 131 slices shown, 18 images]
[im 8/131  mediastinal]
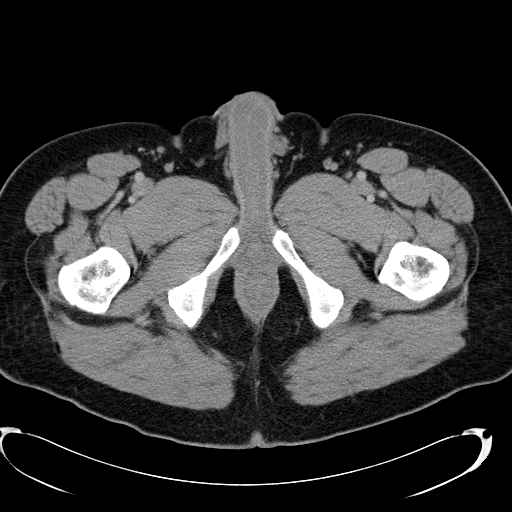
[im 8/131  lung]
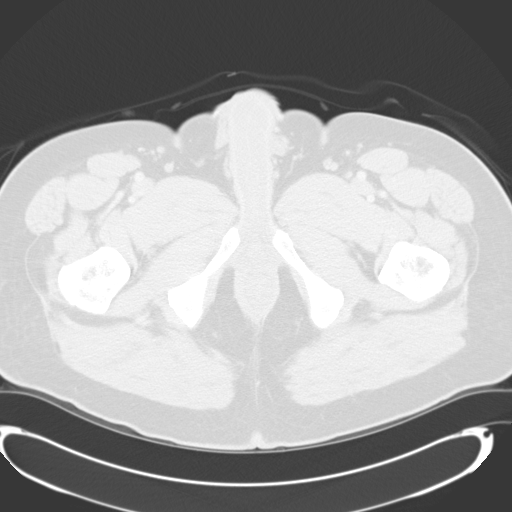
[im 23/131  lung]
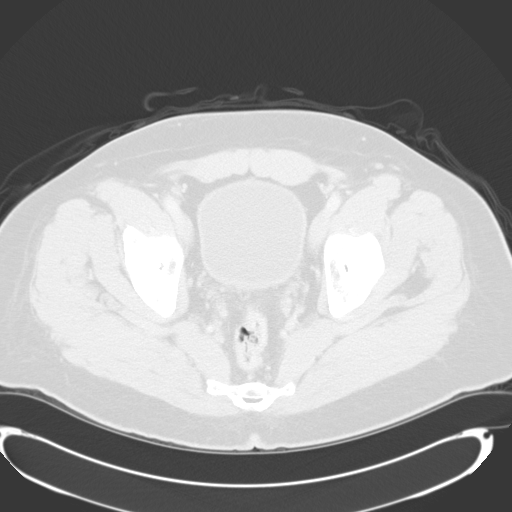
[im 31/131  lung]
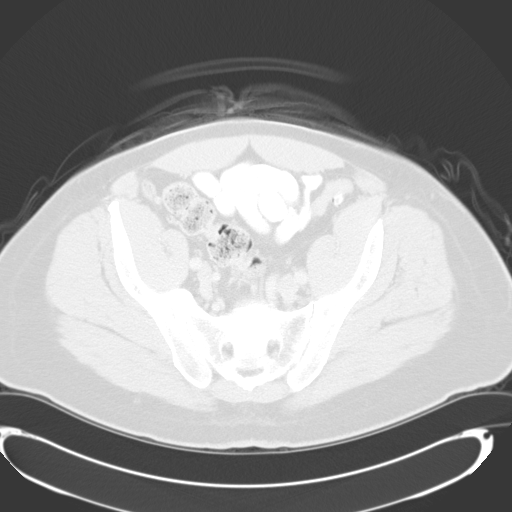
[im 39/131  lung]
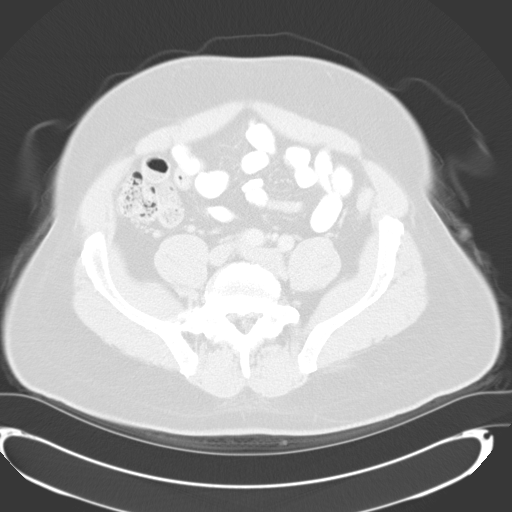
[im 46/131  mediastinal]
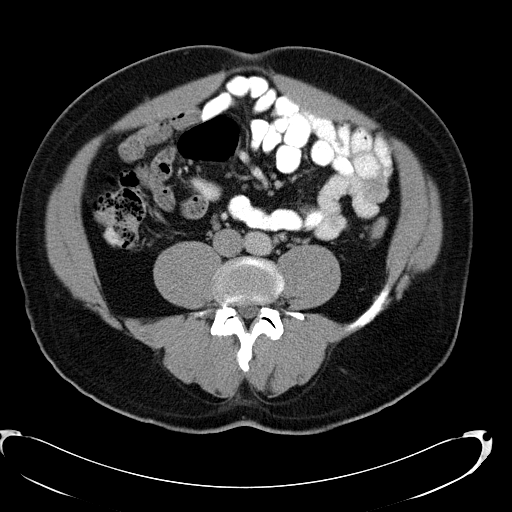
[im 46/131  lung]
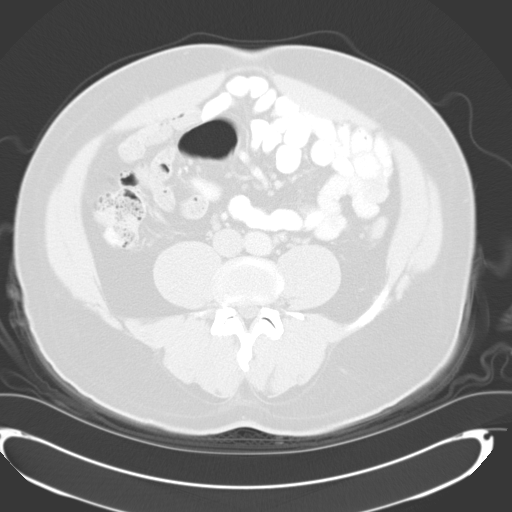
[im 54/131  lung]
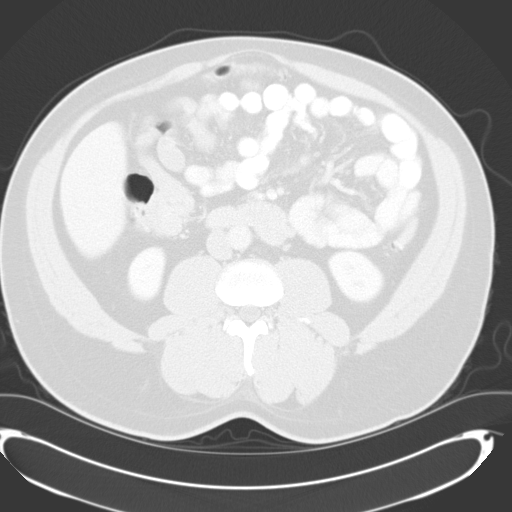
[im 62/131  lung]
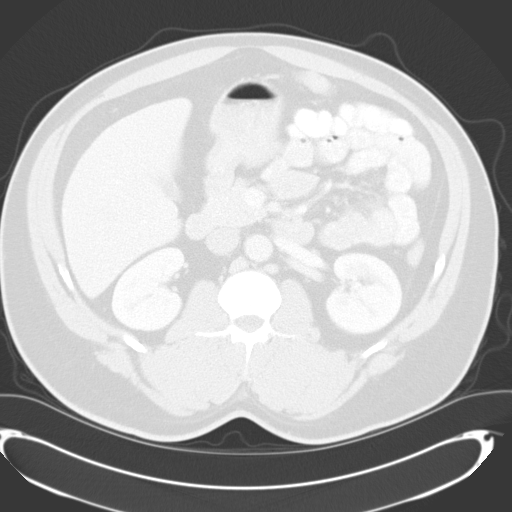
[im 69/131  lung]
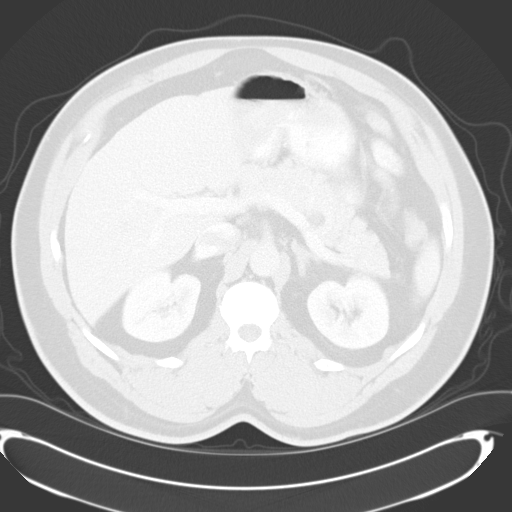
[im 77/131  mediastinal]
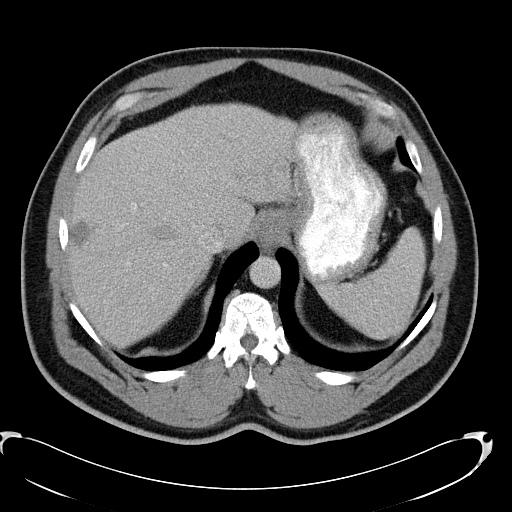
[im 77/131  lung]
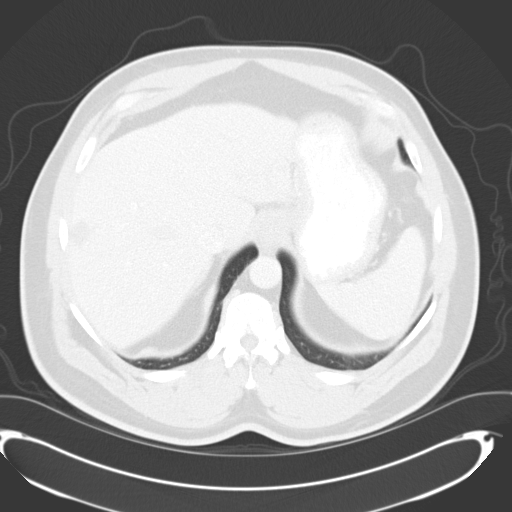
[im 85/131  lung]
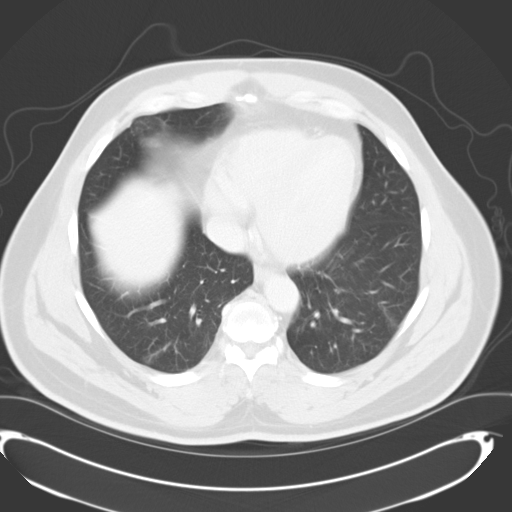
[im 92/131  lung]
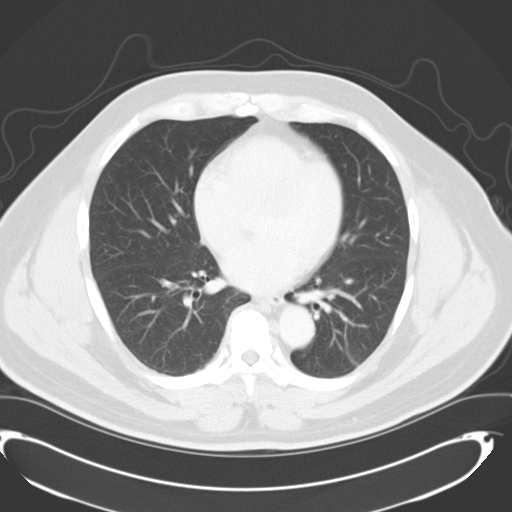
[im 100/131  lung]
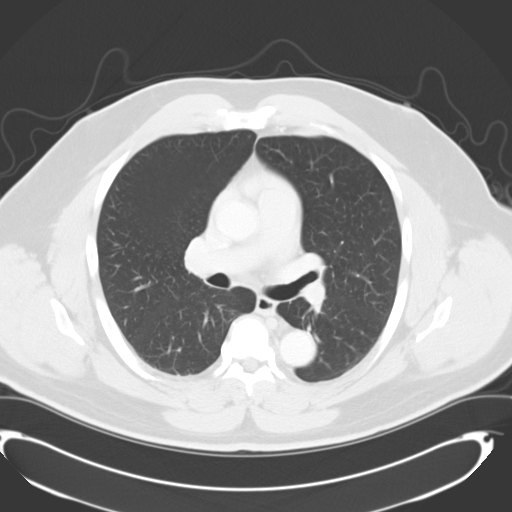
[im 108/131  mediastinal]
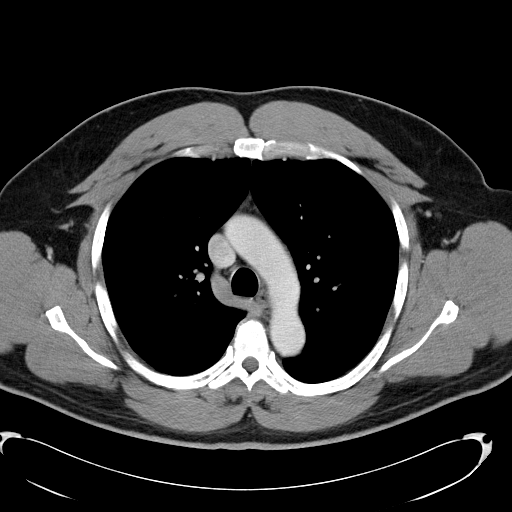
[im 108/131  lung]
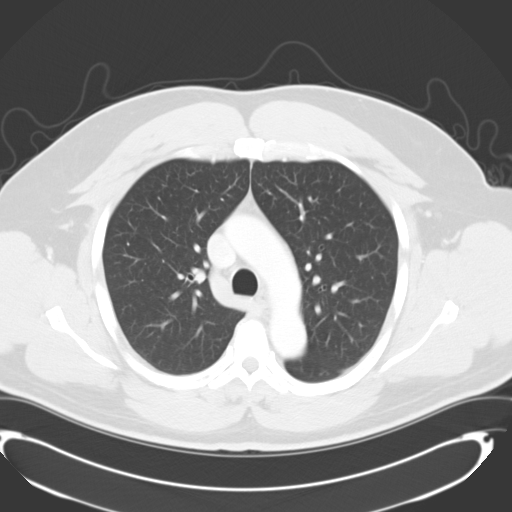
[im 123/131  lung]
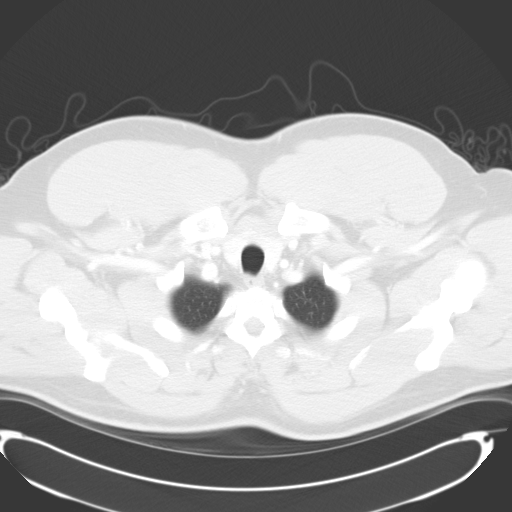

[14 of 32 positions shown; findings below may reference images not displayed]

FINDINGS: CT CHEST FINDINGS

Mediastinum/Nodes: There are no enlarged mediastinal, hilar or
axillary lymph nodes. The thyroid gland, trachea and esophagus
demonstrate no significant findings. The heart size is normal. There
is no pericardial effusion.There are no significant vascular
findings.

Lungs/Pleura: There is no pleural effusion.Mild linear scarring or
atelectasis in both lower lobes. No suspicious pulmonary nodule or
endobronchial lesion.

Musculoskeletal/Chest wall: No chest wall mass or suspicious osseous
findings.

CT ABDOMEN AND PELVIS FINDINGS

Hepatobiliary: 2.4 cm low-density lesion in the right hepatic lobe
demonstrates peripheral discontinuous enhancement (image 54), most
consistent with a hemangioma. This is fully opacified on the delayed
post-contrast images, supporting that diagnosis. No suspicious liver
lesions. There is minimal contour irregularity within the left
hepatic lobe. No evidence of gallstones, gallbladder wall thickening
or biliary dilatation.

Pancreas: Unremarkable. No pancreatic ductal dilatation or
surrounding inflammatory changes.

Spleen: Normal in size without focal abnormality.

Adrenals/Urinary Tract: Both adrenal glands appear normal.The
kidneys appear normal without evidence of urinary tract calculus,
suspicious lesion or hydronephrosis. No bladder abnormalities are
seen.

Stomach/Bowel: The enteric contrast has not yet entered the colon.
The stomach and small bowel appear normal. The appendix appears
normal. There is ill-defined soft tissue fullness near the hepatic
flexure of the colon, best seen on the coronal and sagittal images,
likely representing the patient's known malignancy. No evidence of
bowel obstruction. There are scattered diverticular changes
throughout the colon.No ascites or focal extraluminal fluid
collection.

Vascular/Lymphatic: Small ileocolonic mesenteric lymph nodes near
the suspected hepatic flexure lesion are not pathologically
enlarged, measuring up to 8 mm short axis on image 85. No
retroperitoneal adenopathy. No significant vascular findings are
present.

Reproductive: Unremarkable.

Other: Small umbilical hernia containing only fat.

Musculoskeletal: No acute or significant osseous findings.
IMPRESSION: 1. Suspected mass near the hepatic flexure of the colon
corresponding with known colon cancer. There are adjacent small
ileocolonic mesenteric lymph nodes which are not pathologically
enlarged.
2. No evidence of metastatic disease within the chest, abdomen or
pelvis.
3. Hepatic hemangioma.

## 2015-10-06 ENCOUNTER — Telehealth: Payer: Self-pay | Admitting: Medical

## 2015-10-06 NOTE — Telephone Encounter (Signed)
Pt called and stated that his immunizations were to be faxed to [facility]. Our records indicate that was done on 05/22. He states they never received. Fax number of [phone] was verified and document then faxed again. Called [phone] to verify they received and they confirmed that they did receive.

## 2015-10-18 ENCOUNTER — Other Ambulatory Visit: Payer: BC Managed Care – PPO

## 2015-10-18 ENCOUNTER — Encounter: Payer: Self-pay | Admitting: Medical

## 2015-10-18 ENCOUNTER — Ambulatory Visit (INDEPENDENT_AMBULATORY_CARE_PROVIDER_SITE_OTHER): Payer: BC Managed Care – PPO | Admitting: Medical

## 2015-10-18 VITALS — BP 170/108 | HR 78 | Wt 255.0 lb

## 2015-10-18 DIAGNOSIS — I10 Essential (primary) hypertension: Secondary | ICD-10-CM | POA: Diagnosis not present

## 2015-10-18 MED ORDER — LISINOPRIL-HYDROCHLOROTHIAZIDE 20-25 MG PO TABS: 1.0000 | ORAL_TABLET | Freq: Every day | ORAL | Status: DC

## 2015-10-18 MED ORDER — AMLODIPINE BESYLATE 10 MG PO TABS: 10.0000 mg | ORAL_TABLET | Freq: Every day | ORAL | Status: DC

## 2015-10-18 MED ORDER — PRAVASTATIN SODIUM 20 MG PO TABS: 20.0000 mg | ORAL_TABLET | Freq: Every day | ORAL | Status: DC

## 2015-10-18 MED ORDER — CARVEDILOL 12.5 MG PO TABS: 12.5000 mg | ORAL_TABLET | Freq: Two times a day (BID) | ORAL | Status: DC

## 2015-10-18 NOTE — Progress Notes (Signed)
Subjective: Chief Complaint  Patient presents with  . High BP    came in for nurse visit and bp was 178/108 so moved to schedule   Here for BP concerns.   He had recently went to health clinic at work for a cold, had elevated BP then, but he had been using OTC decongestants.  He wanted to come in today for BP check and it was high today.    Still taking Coreg 12.5mg  BID and Lisinopril HCT 20/12.5mg  daily.    Still using CPAP regularly.    Not checking BP regularly.  Denies chest pain, dyspnea, no leg swelling.     Is awaiting new job, but employer is wanting [place] to see him about the elevated BP.    Last sleep study > 5 years ago.    Past Medical History  Diagnosis Date  . Hypertension   . Obstructive sleep apnea on CPAP     CPAP  . Allergy   . Obesity   . Colon cancer ([place]) 08/2014    Stage TIII, N0, invasive adenocarcinoma of hepatic flexure/right colon s/p resection but declined medical oncology f/u  . Family history of colon cancer     mother, sister   Family History  Problem Relation Age of Onset  . Hypertension Mother   . Colon cancer Mother   . Diabetes Maternal Aunt   . Heart disease Neg Hx   . Stroke Neg Hx   . Hyperlipidemia Neg Hx   . Esophageal cancer Neg Hx   . Rectal cancer Neg Hx   . Stomach cancer Neg Hx   . Colon cancer Sister   . Edema Sister      Social History   Social History  . Marital Status: Married    Spouse Name: N/A  . Number of Children: N/A  . Years of Education: N/A   Occupational History  . Not on file.   Social History Main Topics  . Smoking status: Never Smoker   . Smokeless tobacco: Never Used  . Alcohol Use: 4.8 oz/week    5 Cans of beer, 3 Shots of liquor per week     Comment: weekends social  . Drug Use: No  . Sexual Activity: Not on file     Comment: single, engaged, 1 child, exercise 3-4 days/wk, weights, walks a lot at work, works in housekeeping at [employer]; prior surgical tech experience   Other Topics Concern  .  Not on file   Social History Narrative   Married, 25yo child, exercise -  physically active on the job.   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 uses the gym at [place] as in subjective   Objective: BP 170/108 mmHg  Pulse 78  Wt 255 lb (115.667 kg)  BP Readings from Last 3 Encounters:  10/18/50 170/108  09/52/16 180/100  04/52/16 134/85   Wt Readings from Last 3 Encounters:  10/18/50 255 lb (115.667 kg)  02/04/15 257 lb (116.574 kg)  08/28/14 262 lb 12 oz (119.183 kg)   General appearance: alert, no distress, WD/WN, AA male Neck: supple, no lymphadenopathy, no thyromegaly, no masses Heart: RRR, normal S1, S2, no murmurs Lungs: CTA bilaterally, no wheezes, rhonchi, or rales Pulses: 2+ symmetric, upper and lower extremities, normal cap refill Ext: no edema    Assessment: Encounter Diagnosis  Name Primary?  . Essential hypertension Yes     Plan: Discussed his elevated BP.  Avoid decongestants OTC.   Advised BP monitoring at  home as we have no idea what his BPs have been running otherwise.  Advised he bring BP readings in 5mo.   Patient Instructions   Encounter Diagnosis  Name Primary?  . Essential hypertension Yes   Recommendations:  Continue Coreg 12.5mg  twice daily  Increase to Lisinopril HCT 20/25mg  daliy  Add/Begin Amlodipine 10mg  daily.  For the first week, take 1/2 tablet daily.  After a week, then you can take the full tablet daily   Take the blood pressure medications in the morning  Take the Pravachol and Aspirin at night at bedtime  Continue efforts to lose weight and avoid salt  Avoid OTC decongestants  If the future if cough/cold symptoms, use either Mucinex DM or Coricin HBP products    Edward Patterson was seen today for high bp.  Diagnoses and all orders for this visit:  Essential hypertension  Other orders -     carvedilol (COREG) 12.5 MG tablet; Take 1 tablet (12.5 mg total) by mouth 2 (two) times daily with a meal. -      lisinopril-hydrochlorothiazide (PRINZIDE,ZESTORETIC) 20-25 MG tablet; Take 1 tablet by mouth daily. -     amLODipine (NORVASC) 10 MG tablet; Take 1 tablet (10 mg total) by mouth daily. -     pravastatin (PRAVACHOL) 20 MG tablet; Take 1 tablet (20 mg total) by mouth daily.

## 2015-10-18 NOTE — Patient Instructions (Addendum)
Encounter Diagnosis  Name Primary?  . Essential hypertension Yes   Recommendations:  Continue Coreg 12.5mg  twice daily  Increase to Lisinopril HCT 20/25mg  daliy  Add/Begin Amlodipine 10mg  daily.  For the first week, take 1/2 tablet daily.  After a week, then you can take the full tablet daily   Take the blood pressure medications in the morning  Take the Pravachol and Aspirin at night at bedtime  Continue efforts to lose weight and avoid salt  Avoid OTC decongestants  If the future if cough/cold symptoms, use either Mucinex DM or Coricin HBP products

## 2016-02-21 ENCOUNTER — Other Ambulatory Visit: Payer: Self-pay

## 2016-02-21 MED ORDER — AMLODIPINE BESYLATE 10 MG PO TABS: 10.0000 mg | ORAL_TABLET | Freq: Every day | ORAL | 3 refills | Status: DC

## 2016-02-21 MED ORDER — LISINOPRIL-HYDROCHLOROTHIAZIDE 20-25 MG PO TABS: 1.0000 | ORAL_TABLET | Freq: Every day | ORAL | 3 refills | Status: DC

## 2016-02-21 MED ORDER — PRAVASTATIN SODIUM 20 MG PO TABS: 20.0000 mg | ORAL_TABLET | Freq: Every day | ORAL | 1 refills | Status: DC

## 2016-02-21 NOTE — Progress Notes (Signed)
Pt request script to be sent [facility]

## 2016-07-17 ENCOUNTER — Other Ambulatory Visit: Payer: Self-pay | Admitting: Medical

## 2016-07-17 ENCOUNTER — Telehealth: Payer: Self-pay

## 2016-07-17 MED ORDER — LISINOPRIL-HYDROCHLOROTHIAZIDE 20-25 MG PO TABS: 1.0000 | ORAL_TABLET | Freq: Every day | ORAL | 0 refills | Status: DC

## 2016-07-17 MED ORDER — ASPIRIN 81 MG PO TABS: 81.0000 mg | ORAL_TABLET | Freq: Every day | ORAL | 0 refills | Status: DC

## 2016-07-17 MED ORDER — PRAVASTATIN SODIUM 20 MG PO TABS: 20.0000 mg | ORAL_TABLET | Freq: Every day | ORAL | 0 refills | Status: DC

## 2016-07-17 MED ORDER — AMLODIPINE BESYLATE 10 MG PO TABS: 10.0000 mg | ORAL_TABLET | Freq: Every day | ORAL | 0 refills | Status: DC

## 2016-07-17 MED ORDER — CARVEDILOL 12.5 MG PO TABS
12.5000 mg | ORAL_TABLET | Freq: Two times a day (BID) | ORAL | 0 refills | Status: DC
Start: 2016-07-17 — End: 2016-08-03

## 2016-07-17 NOTE — Telephone Encounter (Signed)
Additional fax rcvd for pravastatin, and lisinopril/hctz 20/25

## 2016-07-17 NOTE — Telephone Encounter (Signed)
LMTCB

## 2016-07-17 NOTE — Telephone Encounter (Signed)
meds sent.  Get him in soon for physical.   Last physical visit 2016, last HTN f/u was 10/2015

## 2016-07-17 NOTE — Telephone Encounter (Signed)
Fax rcvd from [facility] for refill of amlodipine. This has been requested to pharmacy to request through [facility]rescript[facility].

## 2016-07-21 NOTE — Telephone Encounter (Signed)
Pt states he has appt on 03/21. Edward Patterson

## 2016-07-22 DIAGNOSIS — H524 Presbyopia: Secondary | ICD-10-CM | POA: Diagnosis not present

## 2016-08-02 ENCOUNTER — Ambulatory Visit (INDEPENDENT_AMBULATORY_CARE_PROVIDER_SITE_OTHER): Payer: 59 | Admitting: Medical

## 2016-08-02 ENCOUNTER — Encounter: Payer: Self-pay | Admitting: Medical

## 2016-08-02 VITALS — BP 144/88 | HR 81 | Ht 70.0 in | Wt 272.6 lb

## 2016-08-02 DIAGNOSIS — Z800 Family history of malignant neoplasm of digestive organs: Secondary | ICD-10-CM

## 2016-08-02 DIAGNOSIS — E785 Hyperlipidemia, unspecified: Secondary | ICD-10-CM

## 2016-08-02 DIAGNOSIS — Z0000 Encounter for general adult medical examination without abnormal findings: Secondary | ICD-10-CM | POA: Diagnosis not present

## 2016-08-02 DIAGNOSIS — G4733 Obstructive sleep apnea (adult) (pediatric): Secondary | ICD-10-CM

## 2016-08-02 DIAGNOSIS — Z125 Encounter for screening for malignant neoplasm of prostate: Secondary | ICD-10-CM

## 2016-08-02 DIAGNOSIS — Z7185 Encounter for immunization safety counseling: Secondary | ICD-10-CM | POA: Insufficient documentation

## 2016-08-02 DIAGNOSIS — Z1159 Encounter for screening for other viral diseases: Secondary | ICD-10-CM | POA: Diagnosis not present

## 2016-08-02 DIAGNOSIS — I10 Essential (primary) hypertension: Secondary | ICD-10-CM | POA: Diagnosis not present

## 2016-08-02 DIAGNOSIS — Z7189 Other specified counseling: Secondary | ICD-10-CM

## 2016-08-02 DIAGNOSIS — C182 Malignant neoplasm of ascending colon: Secondary | ICD-10-CM

## 2016-08-02 DIAGNOSIS — E669 Obesity, unspecified: Secondary | ICD-10-CM | POA: Diagnosis not present

## 2016-08-02 LAB — POCT URINALYSIS DIPSTICK
Bilirubin, UA: NEGATIVE
Blood, UA: NEGATIVE
Glucose, UA: NEGATIVE
Ketones, UA: NEGATIVE
LEUKOCYTES UA: NEGATIVE
Nitrite, UA: NEGATIVE
Protein, UA: NEGATIVE
SPEC GRAV UA: 1.025 (ref 1.030–1.035)
UROBILINOGEN UA: NEGATIVE (ref ?–2.0)
pH, UA: 6 (ref 5.0–8.0)

## 2016-08-02 LAB — PSA: PSA: 1.1 ng/mL (ref <=4.0)

## 2016-08-02 LAB — CBC
HCT: 50.3 % — ABNORMAL HIGH (ref 38.5–50.0)
HEMOGLOBIN: 16.6 g/dL (ref 13.2–17.1)
MCH: 28.8 pg (ref 27.0–33.0)
MCHC: 33 g/dL (ref 32.0–36.0)
MCV: 87.2 fL (ref 80.0–100.0)
MPV: 10.6 fL (ref 7.5–12.5)
PLATELETS: 196 10*3/uL (ref 140–400)
RBC: 5.77 MIL/uL (ref 4.20–5.80)
RDW: 14.1 % (ref 11.0–15.0)
WBC: 3.9 10*3/uL — AB (ref 4.0–10.5)

## 2016-08-02 LAB — TSH: TSH: 1.15 m[IU]/L (ref 0.40–4.50)

## 2016-08-02 NOTE — Progress Notes (Signed)
Counseled on diet   08/02/16 1613  BMI Metric Follow Up-Please document annually  BMI Metric Follow Up Education provided;Exercise counseling;Nutrition counseling

## 2016-08-02 NOTE — Progress Notes (Signed)
-  Counseled on diet and exercise.

## 2016-08-02 NOTE — Progress Notes (Signed)
Subjective:   HPI  Edward Patterson is a 51 y.o. male who presents for physical Chief Complaint  Patient presents with  . Annual Exam    physical, no concerns    Medical care team includes: Michela Pitcher, here for primary care Dentist Eye doctor [name], GI Dr. Dalbert Batman, general surgery  Concerns: Since last visit he has cut out alcohol completely.   HTN - No prior echo or treadmill stress test.  Doesn't check BPs at home.  OSA - uses CPAP daily.    Compliant with medications, avoids sodium.   Uses low sodium seasoning.   Had flu shot at work this 02/2016  Reviewed their medical, surgical, family, social, medication, and allergy history and updated chart as appropriate.  Past Medical History:  Diagnosis Date  . Allergy   . Colon cancer ([place]) 08/2014   Stage TIII, N0, invasive adenocarcinoma of hepatic flexure/right colon s/p resection but declined medical oncology f/u  . Family history of colon cancer    mother, sister  . Hyperlipidemia   . Hypertension   . Obesity   . Obstructive sleep apnea on CPAP    CPAP    Past Surgical History:  Procedure Laterality Date  . APPENDECTOMY  08/2014   along with right colectomy  . COLONOSCOPY WITH PROPOFOL N/A 07/13/2014   Procedure: COLONOSCOPY WITH PROPOFOL;  Surgeon: Inda Castle, MD;  Location: [facility];  Service: Endoscopy;  Laterality: N/A;  . HOT HEMOSTASIS N/A 07/13/2014   Procedure: HOT HEMOSTASIS (ARGON PLASMA COAGULATION/BICAP);  Surgeon: Inda Castle, MD;  Location: [facility];  Service: Endoscopy;  Laterality: N/A;  . LAPAROSCOPIC PARTIAL COLECTOMY N/A 08/28/2014   Procedure: LAPAROSCOPIC ASSISTED right COLECTOMY, ;  Surgeon: Fanny Skates, MD;  Location: [facility];  Service: General;  Laterality: N/A;  . WISDOM TOOTH EXTRACTION      Social History   Social History  . Marital status: Married    Spouse name: N/A  . Number of children: N/A  . Years of education: N/A   Occupational History   . Not on file.   Social History Main Topics  . Smoking status: Never Smoker  . Smokeless tobacco: Never Used  . Alcohol use No  . Drug use: No  . Sexual activity: Not on file     Comment: single, engaged, 1 child, exercise 3-4 days/wk, weights, walks a lot at work, works in housekeeping at [employer]; prior surgical tech experience   Other Topics Concern  . Not on file   Social History Narrative   Married, 25yo child, exercise -  physically active on the job.   Housekeeping [employer]. Sometimes uses the gym at [facility].  07/2016    Family History  Problem Relation Age of Onset  . Hypertension Mother   . Colon cancer Mother   . Colon cancer Sister   . Edema Sister   . Diabetes Maternal Aunt   . Heart disease Neg Hx   . Stroke Neg Hx   . Hyperlipidemia Neg Hx   . Esophageal cancer Neg Hx   . Rectal cancer Neg Hx   . Stomach cancer Neg Hx      Current Outpatient Prescriptions:  .  amLODipine (NORVASC) 10 MG tablet, Take 1 tablet (10 mg total) by mouth daily., Disp: 90 tablet, Rfl: 0 .  aspirin 81 MG tablet, Take 1 tablet (81 mg total) by mouth at bedtime., Disp: 90 tablet, Rfl: 0 .  carvedilol (COREG) 12.5 MG tablet,  Take 1 tablet (12.5 mg total) by mouth 2 (two) times daily with a meal., Disp: 180 tablet, Rfl: 0 .  lisinopril-hydrochlorothiazide (PRINZIDE,ZESTORETIC) 20-25 MG tablet, Take 1 tablet by mouth daily., Disp: 90 tablet, Rfl: 0 .  Multiple Vitamin (MULTIVITAMIN) capsule, Take 1 capsule by mouth every morning. , Disp: , Rfl:  .  Omega-3 Fatty Acids (FISH OIL PO), Take 1 capsule by mouth daily at 3 pm. , Disp: , Rfl:  .  pravastatin (PRAVACHOL) 20 MG tablet, Take 1 tablet (20 mg total) by mouth daily., Disp: 90 tablet, Rfl: 0  No Known Allergies   Review of Systems Constitutional: -fever, -chills, -sweats, -unexpected weight change, -decreased appetite, -fatigue Allergy: -sneezing, -itching, -congestion Dermatology: -changing moles, --rash, -lumps ENT: -runny  nose, -ear pain, -sore throat, -hoarseness, -sinus pain, -teeth pain, - ringing in ears, -hearing loss, -nosebleeds Cardiology: -chest pain, -palpitations, -swelling, -difficulty breathing when lying flat, -waking up short of breath Respiratory: -cough, -shortness of breath, -difficulty breathing with exercise or exertion, -wheezing, -coughing up blood Gastroenterology: -abdominal pain, -nausea, -vomiting, -diarrhea, -constipation, -blood in stool, -changes in bowel movement, -difficulty swallowing or eating Hematology: -bleeding, -bruising  Musculoskeletal: -joint aches, -muscle aches, -joint swelling, -back pain, -neck pain, -cramping, -changes in gait Ophthalmology: denies vision changes, eye redness, itching, discharge Urology: -burning with urination, -difficulty urinating, -blood in urine, -urinary frequency, -urgency, -incontinence Neurology: -headache, -weakness, -tingling, -numbness, -memory loss, -falls, -dizziness Psychology: -depressed mood, -agitation, -sleep problems     Objective:   BP (!) 144/88   Pulse 81   Ht 5\' 10"  (1.778 m)   Wt 272 lb 9.6 oz (123.7 kg)   SpO2 98%   BMI 39.11 kg/m   BP Readings from Last 3 Encounters:  08/02/16 (!) 144/88  10/18/15 (!) 170/108  02/04/15 (!) 180/100   Wt Readings from Last 3 Encounters:  08/02/16 272 lb 9.6 oz (123.7 kg)  10/18/15 255 lb (115.7 kg)  02/04/15 257 lb (116.6 kg)    General appearance: alert, no distress, WD/WN, African American male Skin: skin tags around perimeter of neck and left face, otherwise no worrisome lesions  HEENT: normocephalic, conjunctiva/corneas normal, sclerae anicteric, PERRLA, EOMi, nares patent, no discharge or erythema, pharynx normal Oral cavity: MMM, tongue normal, teeth in good repair Neck: supple, no lymphadenopathy, no thyromegaly, no masses, normal ROM, no bruits Chest: non tender, normal shape and expansion Heart: RRR, normal S1, S2, no murmurs Lungs: CTA bilaterally, no wheezes,  rhonchi, or rales Abdomen: +bs, soft, lower abdominal vertical surgical scar, small reducible hernia of umbilicus, otherwise non tender, non distended, no masses, no hepatomegaly, no splenomegaly, no bruits Back: non tender, normal ROM, no scoliosis Musculoskeletal: upper extremities non tender, no obvious deformity, normal ROM throughout, lower extremities non tender, no obvious deformity, normal ROM throughout Extremities: no edema, no cyanosis, no clubbing Pulses: 2+ symmetric, upper and lower extremities, normal cap refill Neurological: alert, oriented x 3, CN2-12 intact, strength normal upper extremities and lower extremities, sensation normal throughout, DTRs 2+ throughout, no cerebellar signs, gait normal Psychiatric: normal affect, behavior normal, pleasant  GU: normal male external genitalia,circumcised, nontender, no masses, no hernia, no lymphadenopathy Rectal: anus normal tone, given body habitus, couldn't examine entire prostate, but limited exam WNL    Adult ECG Report  Indication: HTN  Rate: 82 bpm  Rhythm: normal sinus rhythm  QRS Axis: 58 degrees  PR Interval: 129ms  QRS Duration: 24ms  QTc: 451ms  Conduction Disturbances: none  Other Abnormalities: none  Patient's cardiac risk  factors are: dyslipidemia, hypertension, male gender and obesity (BMI >= 30 kg/m2).  EKG comparison: 2015   Narrative Interpretation: no acute changes     Assessment and Plan :    Encounter Diagnoses  Name Primary?  . Encounter for health maintenance examination in adult Yes  . Essential hypertension   . OSA (obstructive sleep apnea)   . Cancer of ascending colon s/p lap assisted right colectomy 08/28/14   . Family history of colon cancer   . Obesity with serious comorbidity, unspecified classification, unspecified obesity type   . Screening for prostate cancer   . Vaccine counseling   . Hyperlipidemia, unspecified hyperlipidemia type   . Need for hepatitis C screening test      Physical exam - discussed and counseled on healthy lifestyle, diet, exercise, preventative care, vaccinations, sick and well care, proper use of emergency dept and after hours care, and addressed their concerns.    After reviewing his health history, there was confusion over his 08/2014 colonoscopy findings.  He swears he was never told about diagnosis of colon cancer.   Looking back through documentation in the chart, it appears he was informed by phone of the findings from surgery office.   I documented where we reviewed this at his physical in late 2016, yet he acts as though he has no knowledge of cancer findings that he was told by surgeon everything was normal or fine.   He was upset at this point in the conversation.   I advised that I would double check with surgery/GI, but he is very likely due back for repeat colonoscopy at this time.      Cancer screening Discussed colonoscopy screening age 29yo unless higher risk for earlier screening Discussed PSA, prostate exam, and prostate cancer screening risks/benefits.   Discussed prostate symptoms as well.  Prostate screening performed: Yes Discussed other symptoms/signs that would suggest need to do other evaluation, such as weight loss, skin lesion changes, fever, night sweats, or other.  Vaccinations: Counseled on the following vaccines:  influenza and shingles Advised he check insurance coverage for shingles vaccine  Acute issues discussed: none  Separate significant chronic issues discussed: HTN - not at goal.   He is compliant with medications.  EKG reviewed.   Pending labs, will modify regimen, but encouraged lifestyle changes  OSA - compliant with CPAP.   Hyperlipidemia - compliant with medication  Obesity - work on weight loss efforts through healthy diet, exercise  Body mass index is 39.11 kg/m.     Zayvion was seen today for annual exam.  Diagnoses and all orders for this visit:  Encounter for health maintenance  examination in adult -     Comprehensive metabolic panel -     Lipid panel -     CBC -     TSH -     PSA -     Hemoglobin A1c -     Microalbumin / creatinine urine ratio -     Hepatitis C antibody -     Urinalysis Dipstick  Essential hypertension -     Lipid panel -     Hemoglobin A1c  OSA (obstructive sleep apnea)  Cancer of ascending colon s/p lap assisted right colectomy 08/28/14  Family history of colon cancer  Obesity with serious comorbidity, unspecified classification, unspecified obesity type -     Hemoglobin A1c  Screening for prostate cancer -     PSA  Vaccine counseling  Hyperlipidemia, unspecified hyperlipidemia type -  Lipid panel -     Hemoglobin A1c  Need for hepatitis C screening test -     Hepatitis C antibody  Other orders -     Cancel: Urinalysis Dipstick   Follow-up pending labs, yearly for physical

## 2016-08-03 ENCOUNTER — Other Ambulatory Visit: Payer: Self-pay | Admitting: Medical

## 2016-08-03 DIAGNOSIS — I10 Essential (primary) hypertension: Secondary | ICD-10-CM

## 2016-08-03 DIAGNOSIS — R933 Abnormal findings on diagnostic imaging of other parts of digestive tract: Secondary | ICD-10-CM

## 2016-08-03 LAB — COMPREHENSIVE METABOLIC PANEL
ALT: 31 U/L (ref 9–46)
AST: 26 U/L (ref 10–35)
Albumin: 4.1 g/dL (ref 3.6–5.1)
Alkaline Phosphatase: 69 U/L (ref 40–115)
BILIRUBIN TOTAL: 1 mg/dL (ref 0.2–1.2)
BUN: 18 mg/dL (ref 7–25)
CO2: 23 mmol/L (ref 20–31)
CREATININE: 0.97 mg/dL (ref 0.70–1.33)
Calcium: 9.2 mg/dL (ref 8.6–10.3)
Chloride: 101 mmol/L (ref 98–110)
GLUCOSE: 105 mg/dL — AB (ref 65–99)
Potassium: 4.1 mmol/L (ref 3.5–5.3)
SODIUM: 139 mmol/L (ref 135–146)
Total Protein: 6.7 g/dL (ref 6.1–8.1)

## 2016-08-03 LAB — LIPID PANEL
Cholesterol: 134 mg/dL (ref <200)
HDL: 44 mg/dL (ref >40)
LDL CALC: 68 mg/dL (ref <100)
TRIGLYCERIDES: 109 mg/dL (ref <150)
Total CHOL/HDL Ratio: 3 Ratio (ref <5.0)
VLDL: 22 mg/dL (ref <30)

## 2016-08-03 LAB — MICROALBUMIN / CREATININE URINE RATIO
CREATININE, URINE: 158 mg/dL (ref 20–370)
Microalb Creat Ratio: 2 mcg/mg creat (ref <30)
Microalb, Ur: 0.3 mg/dL

## 2016-08-03 LAB — HEPATITIS C ANTIBODY: HCV AB: NEGATIVE

## 2016-08-03 LAB — HEMOGLOBIN A1C
Hgb A1c MFr Bld: 5.4 % (ref <5.7)
MEAN PLASMA GLUCOSE: 108 mg/dL

## 2016-08-03 MED ORDER — AMLODIPINE BESYLATE 10 MG PO TABS: 10.0000 mg | ORAL_TABLET | Freq: Every day | ORAL | 3 refills | Status: DC

## 2016-08-03 MED ORDER — CARVEDILOL 12.5 MG PO TABS: 12.5000 mg | ORAL_TABLET | Freq: Two times a day (BID) | ORAL | 3 refills | Status: DC

## 2016-08-03 MED ORDER — ASPIRIN 81 MG PO TABS: 81.0000 mg | ORAL_TABLET | Freq: Every day | ORAL | 3 refills | Status: DC

## 2016-08-03 MED ORDER — AZILSARTAN-CHLORTHALIDONE 40-25 MG PO TABS: 1.0000 | ORAL_TABLET | Freq: Every day | ORAL | 1 refills | Status: DC

## 2016-08-03 MED ORDER — PRAVASTATIN SODIUM 20 MG PO TABS: 20.0000 mg | ORAL_TABLET | Freq: Every day | ORAL | 3 refills | Status: DC

## 2016-08-03 NOTE — Progress Notes (Unsigned)
See referrals

## 2016-08-03 NOTE — Addendum Note (Signed)
Addended by: Carlena Hurl on: 08/03/2016 08:19 AM   Modules accepted: Orders

## 2016-08-07 ENCOUNTER — Telehealth: Payer: Self-pay

## 2016-08-07 NOTE — Telephone Encounter (Signed)
Pt called and said that he when to the pharmacy to pick up his rx for his  New b/p edarbyclor  He said that he couldn't afford it .

## 2016-08-08 NOTE — Telephone Encounter (Signed)
Edward Patterson was there a prior auth or what is copay.   Per the drug rep last week it should be $10 copay.   If this is not the case, he can use a specific pharmacy that offers this program.  The drug rep was here last week about this.  If he is unwilling to go this route, then I will need to use something else.  He is technically on 4 bp medications already so he should be able to meet prior auth requirements

## 2016-08-10 ENCOUNTER — Telehealth: Payer: Self-pay | Admitting: Gastroenterology

## 2016-08-10 NOTE — Telephone Encounter (Signed)
Edward Patterson please see Dr. Loletha Carrow' response. This patient's BMI in under 50, weight is less than 350 lbs, looks like he can go to the [facility]. Please schedule him a pre-visit and colonoscopy at [facility].

## 2016-08-10 NOTE — Telephone Encounter (Signed)
He was diagnosed with colon cancer requiring right hemicolectomy in April 2016.  He is due for a surveillance colonoscopy.  Dr Deatra Ina most likely did the last one at [facility] because he attempted a complex polypectomy.  If the patient's BMI is under 50 and he otherwise meets [place], then it can be directly booked in the [facility].  If not, then please direct book for [facility] endo lab.  It is routine, so if needs to be at [facility], then it can wait until my next available [facility] endo block which is probably June.

## 2016-08-10 NOTE — Telephone Encounter (Signed)
Called pt & asked if willing to switch to [facility] for discount, said yes.  I called Edarbyclor to [employer] and with insurance & discount card cost is $10 a month.  Called pt & informed.

## 2016-08-10 NOTE — Telephone Encounter (Signed)
Will you please review this patient's chart, he is due for repeat colonoscopy. His last one was at [facility] by Dr. Deatra Ina. Does he need to be scheduled at [facility] or the hospital.

## 2016-08-14 ENCOUNTER — Encounter: Payer: Self-pay | Admitting: Gastroenterology

## 2016-08-14 ENCOUNTER — Encounter: Payer: Self-pay | Admitting: Internal Medicine

## 2016-08-14 NOTE — Telephone Encounter (Signed)
Left message for patient to return my call.

## 2016-10-03 ENCOUNTER — Ambulatory Visit (AMBULATORY_SURGERY_CENTER): Payer: Self-pay

## 2016-10-03 ENCOUNTER — Encounter: Payer: Self-pay | Admitting: Gastroenterology

## 2016-10-03 VITALS — Ht 70.0 in | Wt 273.8 lb

## 2016-10-03 DIAGNOSIS — Z86010 Personal history of colonic polyps: Secondary | ICD-10-CM

## 2016-10-03 DIAGNOSIS — Z800 Family history of malignant neoplasm of digestive organs: Secondary | ICD-10-CM

## 2016-10-03 DIAGNOSIS — Z85038 Personal history of other malignant neoplasm of large intestine: Secondary | ICD-10-CM

## 2016-10-03 MED ORDER — NA SULFATE-K SULFATE-MG SULF 17.5-3.13-1.6 GM/177ML PO SOLN: ORAL | 0 refills | Status: DC

## 2016-10-03 NOTE — Progress Notes (Signed)
Per pt, no allergies to soy or egg products.Pt not taking any weight loss meds or using  O2 at home.   Pt refused Emmi video. 

## 2016-10-13 HISTORY — PX: COLONOSCOPY: SHX174

## 2016-10-17 ENCOUNTER — Encounter: Payer: Self-pay | Admitting: Gastroenterology

## 2016-10-17 ENCOUNTER — Ambulatory Visit (AMBULATORY_SURGERY_CENTER): Payer: 59 | Admitting: Gastroenterology

## 2016-10-17 ENCOUNTER — Telehealth: Payer: Self-pay | Admitting: Gastroenterology

## 2016-10-17 VITALS — BP 114/62 | HR 77 | Temp 97.1°F | Resp 11 | Ht 70.0 in | Wt 272.0 lb

## 2016-10-17 DIAGNOSIS — D128 Benign neoplasm of rectum: Secondary | ICD-10-CM

## 2016-10-17 DIAGNOSIS — K635 Polyp of colon: Secondary | ICD-10-CM

## 2016-10-17 DIAGNOSIS — D129 Benign neoplasm of anus and anal canal: Secondary | ICD-10-CM

## 2016-10-17 DIAGNOSIS — Z85038 Personal history of other malignant neoplasm of large intestine: Secondary | ICD-10-CM

## 2016-10-17 DIAGNOSIS — G4733 Obstructive sleep apnea (adult) (pediatric): Secondary | ICD-10-CM | POA: Diagnosis not present

## 2016-10-17 DIAGNOSIS — D123 Benign neoplasm of transverse colon: Secondary | ICD-10-CM

## 2016-10-17 DIAGNOSIS — K621 Rectal polyp: Secondary | ICD-10-CM | POA: Diagnosis not present

## 2016-10-17 DIAGNOSIS — I10 Essential (primary) hypertension: Secondary | ICD-10-CM | POA: Diagnosis not present

## 2016-10-17 MED ORDER — SODIUM CHLORIDE 0.9 % IV SOLN: 500.0000 mL | INTRAVENOUS | Status: DC

## 2016-10-17 NOTE — Patient Instructions (Signed)
YOU HAD AN ENDOSCOPIC PROCEDURE TODAY AT [facility]:   Refer to the procedure report that was given to you for any specific questions about what was found during the examination.  If the procedure report does not answer your questions, please call your gastroenterologist to clarify.  If you requested that your care partner not be given the details of your procedure findings, then the procedure report has been included in a sealed envelope for you to review at your convenience later.  YOU SHOULD EXPECT: Some feelings of bloating in the abdomen. Passage of more gas than usual.  Walking can help get rid of the air that was put into your GI tract during the procedure and reduce the bloating. If you had a lower endoscopy (such as a colonoscopy or flexible sigmoidoscopy) you may notice spotting of blood in your stool or on the toilet paper. If you underwent a bowel prep for your procedure, you may not have a normal bowel movement for a few days.  Please Note:  You might notice some irritation and congestion in your nose or some drainage.  This is from the oxygen used during your procedure.  There is no need for concern and it should clear up in a day or so.  SYMPTOMS TO REPORT IMMEDIATELY:   Following lower endoscopy (colonoscopy or flexible sigmoidoscopy):  Excessive amounts of blood in the stool  Significant tenderness or worsening of abdominal pains  Swelling of the abdomen that is new, acute  Fever of 100F or higher   For urgent or emergent issues, a gastroenterologist can be reached at any hour by calling [phone].   DIET:  We do recommend a small meal at first, but then you may proceed to your regular diet.  Drink plenty of fluids but you should avoid alcoholic beverages for 24 hours.  ACTIVITY:  You should plan to take it easy for the rest of today and you should NOT DRIVE or use heavy machinery until tomorrow (because of the sedation medicines used during the test).     FOLLOW UP: Our staff will call the number listed on your records the next business day following your procedure to check on you and address any questions or concerns that you may have regarding the information given to you following your procedure. If we do not reach you, we will leave a message.  However, if you are feeling well and you are not experiencing any problems, there is no need to return our call.  We will assume that you have returned to your regular daily activities without incident.  If any biopsies were taken you will be contacted by phone or by letter within the next 1-3 weeks.  Please call us at [phone] if you have not heard about the biopsies in 3 weeks.   Await for biopsy results to determine next repeat Colonoscopy screening    SIGNATURES/CONFIDENTIALITY: You and/or your care partner have signed paperwork which will be entered into your electronic medical record.  These signatures attest to the fact that that the information above on your After Visit Summary has been reviewed and is understood.  Full responsibility of the confidentiality of this discharge information lies with you and/or your care-partner.

## 2016-10-17 NOTE — Progress Notes (Signed)
To recovery, report to Jones, RN, VSS 

## 2016-10-17 NOTE — Progress Notes (Signed)
Called to room to assist during endoscopic procedure.  Patient ID and intended procedure confirmed with present staff. Received instructions for my participation in the procedure from the performing physician.  

## 2016-10-17 NOTE — Telephone Encounter (Signed)
Please send a referral to the Genetic Counselor at the [facility] cancer center for : Personal and Family History of Colon Cancer

## 2016-10-17 NOTE — Op Note (Signed)
[name] Patient Name: Edward Patterson Procedure Date: 10/17/2016 8:34 AM MRN: [id number] Endoscopist: Mallie Mussel L. Edward Patterson , MD Age: 51 Referring MD:  Date of Birth: 1965/11/13 Gender: Male Account #: [id number] Procedure:                Colonoscopy Indications:              High risk colon cancer surveillance: Personal                            history of colon cancer (right hemicolectomy for                            cancer in 2016) Medicines:                Monitored Anesthesia Care Procedure:                Pre-Anesthesia Assessment:                           - Prior to the procedure, a History and Physical                            was performed, and patient medications and                            allergies were reviewed. The patient's tolerance of                            previous anesthesia was also reviewed. The risks                            and benefits of the procedure and the sedation                            options and risks were discussed with the patient.                            All questions were answered, and informed consent                            was obtained. Anticoagulants: The patient has taken                            aspirin. It was decided not to withhold this                            medication prior to the procedure. ASA Grade                            Assessment: III - A patient with severe systemic                            disease. After reviewing the risks and benefits,  the patient was deemed in satisfactory condition to                            undergo the procedure.                           After obtaining informed consent, the colonoscope                            was passed under direct vision. Throughout the                            procedure, the patient's blood pressure, pulse, and                            oxygen saturations were monitored continuously. The      Colonoscope was introduced through the anus and                            advanced to the the ileocolonic anastomosis. The                            colonoscopy was performed without difficulty. The                            patient tolerated the procedure well. The quality                            of the bowel preparation was good. The anastomosis                            and the rectum were photographed. The bowel                            preparation used was SUPREP. Scope In: 8:45:51 AM Scope Out: 9:04:59 AM Scope Withdrawal Time: 0 hours 16 minutes 13 seconds  Total Procedure Duration: 0 hours 19 minutes 8 seconds  Findings:                 The perianal and digital rectal examinations were                            normal.                           Three sessile polyps were found in the rectum, mid                            transverse colon and hepatic flexure. The polyps                            were 2 to 4 mm in size. These polyps were removed                            with a  piecemeal technique using a cold biopsy                            forceps. Resection and retrieval were complete.                           There was evidence of a prior side-to-side                            ileo-colonic anastomosis at the hepatic flexure.                            This was patent and was characterized by healthy                            appearing mucosa.                           The exam was otherwise without abnormality on                            direct and retroflexion views. Complications:            No immediate complications. Estimated Blood Loss:     Estimated blood loss was minimal. Impression:               - Three 2 to 4 mm polyps in the rectum, in the mid                            transverse colon and at the hepatic flexure,                            removed piecemeal using a cold biopsy forceps.                            Resected and retrieved.                            - Patent side-to-side ileo-colonic anastomosis,                            characterized by healthy appearing mucosa.                           - The examination was otherwise normal on direct                            and retroflexion views. Recommendation:           - Patient has a contact number available for                            emergencies. The signs and symptoms of potential                            delayed complications were discussed with the  patient. Return to normal activities tomorrow.                            Written discharge instructions were provided to the                            patient.                           - Resume previous diet.                           - Continue present medications.                           - Await pathology results.                           - Repeat colonoscopy in 3 years for surveillance.                            Results of genetic testing could shorten this                            duration.                           - Refer to a genetics counselor due to personal                            history of colon cancer at age 59 as well as a                            strong family history of colon cancer. Henry L. Edward Carrow, MD 10/17/2016 9:11:00 AM This report has been signed electronically.

## 2016-10-18 ENCOUNTER — Other Ambulatory Visit: Payer: Self-pay

## 2016-10-18 ENCOUNTER — Telehealth: Payer: Self-pay | Admitting: *Deleted

## 2016-10-18 DIAGNOSIS — Z85038 Personal history of other malignant neoplasm of large intestine: Secondary | ICD-10-CM

## 2016-10-18 DIAGNOSIS — Z800 Family history of malignant neoplasm of digestive organs: Secondary | ICD-10-CM

## 2016-10-18 NOTE — Telephone Encounter (Signed)
  Follow up Call-  Call back number 10/17/2016 06/03/2014  Post procedure Call Back phone  # [phone]  Permission to leave phone message Yes Yes  Some recent data might be hidden     Patient questions:  Do you have a fever, pain , or abdominal swelling? No. Pain Score  0 *  Have you tolerated food without any problems? Yes.    Have you been able to return to your normal activities? Yes.    Do you have any questions about your discharge instructions: Diet   No. Medications  No. Follow up visit  No.  Do you have questions or concerns about your Care? No.  Actions: * If pain score is 4 or above: No action needed, pain <4.

## 2016-10-18 NOTE — Telephone Encounter (Signed)
AMB referral sent 10-18-2016. Will await appointment information.

## 2016-10-24 ENCOUNTER — Encounter: Payer: Self-pay | Admitting: Gastroenterology

## 2016-10-31 NOTE — Telephone Encounter (Signed)
Left a message for a return call at oncology to check on referral status.

## 2016-11-06 ENCOUNTER — Telehealth: Payer: Self-pay | Admitting: Genetics

## 2016-11-06 ENCOUNTER — Telehealth: Payer: Self-pay | Admitting: Gastroenterology

## 2016-11-06 NOTE — Telephone Encounter (Signed)
Slater back about the referral she had asked about. States that patient is scheduled for tomorrow 6.26.18.

## 2016-11-06 NOTE — Telephone Encounter (Signed)
Received call from [place] inquiring about referral sent 6/6. Referral sent on 6/6 for genetics. Patient scheduled for genetics appointment 6/26 @ 1 pm. Spoke with patient he is aware.  [facility] informed.

## 2016-11-07 ENCOUNTER — Ambulatory Visit (HOSPITAL_BASED_OUTPATIENT_CLINIC_OR_DEPARTMENT_OTHER): Payer: 59 | Admitting: Genetics

## 2016-11-07 ENCOUNTER — Ambulatory Visit: Payer: 59

## 2016-11-07 DIAGNOSIS — C182 Malignant neoplasm of ascending colon: Secondary | ICD-10-CM

## 2016-11-07 DIAGNOSIS — Z800 Family history of malignant neoplasm of digestive organs: Secondary | ICD-10-CM | POA: Diagnosis not present

## 2016-11-07 NOTE — Telephone Encounter (Signed)
Pt has been scheduled for appointment on 11-07-2016.

## 2016-11-07 NOTE — Telephone Encounter (Signed)
Pt has an appointment on 11-07-2016

## 2016-11-08 ENCOUNTER — Encounter: Payer: Self-pay | Admitting: Genetics

## 2016-11-08 NOTE — Progress Notes (Signed)
REFERRING PROVIDER: Doran Stabler, MD [address] [place], [place] [address]  PRIMARY PROVIDER:  Denita Lung, MD  PRIMARY REASON FOR VISIT:  1. Malignant neoplasm of ascending colon ([place])   2. Family history of colon cancer      HISTORY OF PRESENT ILLNESS:   Mr. Buer, a 51 y.o. male, was seen for a [facility] cancer genetics consultation at the request of Dr. Loletha Carrow due to a personal and family history of cancer.  Mr. Thune presents to clinic today to discuss the possibility of a hereditary predisposition to cancer, genetic testing, and to further clarify his future cancer risks, as well as potential cancer risks for family members.   In April 2016, at the age of 47, Mr. Westwood was diagnosed with adenocarcinoma of the ascending colon, stage T3 N0.  This was treated with laparoscopic assisted right colectomy.  CANCER HISTORY:    Cancer of ascending colon s/p lap assisted right colectomy 08/28/14   07/13/2014 Procedure    Colonoscopy: mass at hepatic flexure      07/17/2014 Imaging    CT C/A/P : 1. Suspected mass near the hepatic flexure of the colon corresponding with known colon cancer. There are adjacent small ileocolonic mesenteric lymph nodes which are not pathologically enlarged. 2. No evidence of metastatic disease       08/28/2014 Initial Diagnosis    Cancer of ascending colon s/p lap assisted right colectomy 08/28/14      08/28/2014 Pathologic Stage    Pathologic Staging: pT3, pN0, pMX      08/28/2014 Pathology Results    Microscopic extension of invasive tumor: Invading through the muscularis propria into pericolonic fatty tissue/Lymph nodes: number examined 20; number positive: 0 / Histologic grade and differentiation: G2: moderately differentiated/low grade/MMR nl      08/28/2014 Clinical Stage    Clinical Stage IIA        Past Medical History:  Diagnosis Date  . Allergy    sinus  . Colon cancer ([place]) 08/2014   Stage TIII, N0, invasive  adenocarcinoma of hepatic flexure/right colon s/p resection but declined medical oncology f/u  . Family history of colon cancer    mother, sister  . Hyperlipidemia   . Hypertension   . Obesity   . Obstructive sleep apnea on CPAP    CPAP    Past Surgical History:  Procedure Laterality Date  . APPENDECTOMY  08/2014   along with right colectomy  . COLONOSCOPY WITH PROPOFOL N/A 07/13/2014   Procedure: COLONOSCOPY WITH PROPOFOL;  Surgeon: Inda Castle, MD;  Location: [facility];  Service: Endoscopy;  Laterality: N/A;  . HOT HEMOSTASIS N/A 07/13/2014   Procedure: HOT HEMOSTASIS (ARGON PLASMA COAGULATION/BICAP);  Surgeon: Inda Castle, MD;  Location: [facility];  Service: Endoscopy;  Laterality: N/A;  . LAPAROSCOPIC PARTIAL COLECTOMY N/A 08/28/2014   Procedure: LAPAROSCOPIC ASSISTED right COLECTOMY, ;  Surgeon: Fanny Skates, MD;  Location: [facility];  Service: General;  Laterality: N/A;  . WISDOM TOOTH EXTRACTION      Social History   Social History  . Marital status: Married    Spouse name: N/A  . Number of children: N/A  . Years of education: N/A   Social History Main Topics  . Smoking status: Never Smoker  . Smokeless tobacco: Never Used  . Alcohol use No  . Drug use: No  . Sexual activity: Not Asked     Comment: single, engaged, 1 child, exercise 3-4 days/wk, weights, walks  a lot at work, works in housekeeping at [employer]; prior TEFL teacher experience   Other Topics Concern  . None   Social History Narrative   Married, 25yo child, exercise -  physically active on the job.   Housekeeping [employer]. Sometimes uses the gym at [facility].  07/2016     FAMILY HISTORY:  We obtained a detailed, 4-generation family history.  Significant diagnoses are listed below: Family History  Problem Relation Age of Onset  . Hypertension Mother   . Colon cancer Mother 47       died at 106  . Colon cancer Sister 45  . Diabetes Father   . Edema Sister   . Diabetes Maternal Aunt    . Cancer Maternal Uncle        type unk- died in his 32's, hx of smoking  . Heart disease Neg Hx   . Stroke Neg Hx   . Hyperlipidemia Neg Hx   . Esophageal cancer Neg Hx   . Rectal cancer Neg Hx   . Stomach cancer Neg Hx    Mr. Couts has a 24 year-old daughter who has no history of cancer.  He has one brother in his late 88's who has no history of cancer.  Mr. Vultaggio has a 72 year-old sister who was diagnosed with colon cancer at 30.  This sister has three sons and a daughter ranging in age from teens-30's.  None of these nieces and nephews have had any history of cancer.  Mr. Svec has another 61 year-old sister who has no history of cancer and has 2 sons in their 32's and 51's with no history of cancer.  Mr. Moffatt has a 67 year-old sister who has no history of cancer, and has 2 daughters in their teens/20's with no history of cancer.    Mr. Thwaites' father is 36 and has Diabetes.  Mr. Deleeuw has 6 paternal aunts and uncles, but does not know information about their health or children.  Mr. Dupee' paternal grandparents died at older ages, but the exact ages and cause of death is unknown.    Mr. Lizotte' mother died at the age of 84 and was diagnosed with colon cancer at the age of 49.  Mr. Bergen has 5 maternal aunts, and 3 maternal uncles.   -One maternal aunt is in her 33's with no history of cancer.  She has a son and daughter both in their 21's with no history of cancer.  -One maternal aunt is in her 55's with no history of cancer and no children.  -One maternal aunt died in her 70's.   She had no history of cancer, and has a 87 year old daughter who has no history of caner. -One Maternal aunt died in her 32's.   She had no history of cancer and no children.  -One maternal aunt is in her 71's and has no history of cancer, and no known children.  -1 paternal uncle died in his 67's from cancer.  The type of cancer is unknown, this uncle reportedly had a history of smoking.   This uncle had no children.  -1 paternal uncle died in his 53's, and had no history of cancer or any children.   -1 paternal uncle is in his 35's with no history of cancer and no children.   Mr. Coulthard' maternal grandfather died in his 53's (maye younger).  The cause is unknown.  This grandfather had 4 sisters with no known history  of cancer.  Mr. Markgraf' maternal grandmother died at 40 due to age related illness.  This grandmother had several siblings, none with any known history of cancer.   Mr. Pandit is unaware of previous family history of genetic testing for hereditary cancer risks. Patient's maternal ancestors are of African American descent, and paternal ancestors are of African American descent. There is No reported Ashkenazi Jewish ancestry. There is No known consanguinity.  GENETIC COUNSELING ASSESSMENT: Elvyn Krohn is a 51 y.o. male with a personal and family history of cancer which is somewhat suggestive of a Hereditary Cancer Predisposition Syndrome and predisposition to cancer. We, therefore, discussed and recommended the following at today's visit.   DISCUSSION: We reviewed the characteristics, features and inheritance patterns of hereditary cancer syndromes. We also discussed genetic testing, including the appropriate family members to test, the process of testing, insurance coverage and turn-around-time for results. We discussed the implications of a negative, positive and/or variant of uncertain significant result. We recommended Mr. Stowers pursue genetic testing for the Common Hereditary Cancer gene panel from [facility]. The Hereditary Gene Panel offered by [facility] includes sequencing and/or deletion duplication testing of the following 46 genes: APC, ATM, AXIN2, BARD1, BMPR1A, BRCA1, BRCA2, BRIP1, CDH1, CDKN2A (p14ARF), CDKN2A (p16INK4a), CHEK2, CTNNA1, DICER1, EPCAM (Deletion/duplication testing only), GREM1 (promoter region deletion/duplication testing only), KIT, MEN1,  MLH1, MSH2, MSH3, MSH6, MUTYH, NBN, NF1, NHTL1, PALB2, PDGFRA, PMS2, POLD1, POLE, PTEN, RAD50, RAD51C, RAD51D, SDHB, SDHC, SDHD, SMAD4, SMARCA4. STK11, TP53, TSC1, TSC2, and VHL.  The following genes were evaluated for sequence changes only: SDHA and HOXB13 c.251G>A variant only.  We discussed that most colon cancers are not due to an inherited mutation.  The most common hereditary cancer syndrome associated with colon cancer is Lynch Syndrome (caused by mutations in the genes MLH1, MSH2, MSH6, PMS2 and EPCAM).  This syndrome increases the risk for colon, uterine, ovarian and stomach cancers, as well as others. Mr. Tallerico had intact Immuno-histo chemistry (IHC) performed on his tumor on 08/31/14.  These proteins were intact, so the suspicion for Lynch Syndrome is low.  However, there are other genes and synromes associated with an increased risk for colon and other types of cancer.   We discussed that if he is found to have a mutation in one of these genes, it may impactmedical management recommendations such as increased cancer screening and other risk-reduction measures.   A positive result could also have implications for the patient's family members.  A Negative result would mean we were unable to identify a hereditary component to his cancer, but does not rule out the possibility of a hereditary basis for his cancer.  There could be mutations that are undetectable by current technology, or in genes not yet tested or identified to increase cancer risk.  We discussed the potential to find a Variant of Uncertain Significance or VUS.  These are variants that have not yet been identified as pathogenic or benign, and it is unknown if this variant is associated with increased cancer risk or if this is a normal finding.  Most VUS's are reclassified to benign or likely benign.   It should not be used to make medical management decisions. With time, we suspect the lab will determine the significance of any VUS's  identified if any.   Based on Mr. Gillie personal and family history of cancer, he meets medical criteria for genetic testing. Despite that he meets criteria, he may still have an out of pocket cost. We discussed that  if his out of pocket cost for testing is over $100, the laboratory will call and confirm whether he wants to proceed with testing.  If the out of pocket cost of testing is less than $100 he will be billed by the genetic testing laboratory.   PLAN: After considering the risks, benefits, and limitations, Mr. Kahl  provided informed consent to pursue genetic testing and the blood sample was sent to [facility] for analysis of the Common Hereditary Cancer 46 gene panel from [facility]. Results should be available within approximately 2-3 weeks' time, at which point they will be disclosed by telephone to Mr. Detty, as will any additional recommendations warranted by these results. Mr. Peacock will receive a summary of his genetic counseling visit and a copy of his results once available. This information will also be available in Epic. We encouraged Mr. Beverlin to remain in contact with cancer genetics annually so that we can continuously update the family history and inform him of any changes in cancer genetics and testing that may be of benefit for his family. Mr. Hayashida questions were answered to his satisfaction today. Our contact information was provided should additional questions or concerns arise.  Lastly, we encouraged Mr. Hurlbut to remain in contact with cancer genetics annually so that we can continuously update the family history and inform him of any changes in cancer genetics and testing that may be of benefit for this family.   Mr.  Durnil questions were answered to his satisfaction today. Our contact information was provided should additional questions or concerns arise. Thank you for the referral and allowing [place] to share in the care of your patient.    Tana Felts, MS Genetic Counselor [email or web address] phone: [phone]  The patient was seen for a total of 45 minutes in face-to-face genetic counseling.

## 2016-12-11 ENCOUNTER — Telehealth: Payer: Self-pay | Admitting: Genetics

## 2016-12-11 NOTE — Telephone Encounter (Signed)
Patient called back and explained that he did receive a call from the laboratory giving him 2 prices.  He cannot afford the test cost at this time and so has not approved the lab to do his testing. He is wondering why insurance isn't covering the test, and I said I can ask the lab and look into weather his insurance covered it or not.

## 2016-12-15 ENCOUNTER — Telehealth: Payer: Self-pay | Admitting: Genetics

## 2016-12-15 NOTE — Telephone Encounter (Signed)
Let Mr. Edward Patterson know what the genetic testing laboratory [facility]) told me about his billing.  Going through insurance was going to have an expected $350 cost so they offered patient pay pricing with options of financial assistance, making payments, or cancelling.  I let him know that when he makes a decision on how he wants to proceed to call the laboratory client services to let them know his decision.

## 2017-02-20 ENCOUNTER — Telehealth: Payer: Self-pay | Admitting: Medical

## 2017-02-20 NOTE — Telephone Encounter (Signed)
Pt needs refill Edarbyclor to different pharmacy [facility]

## 2017-02-21 ENCOUNTER — Other Ambulatory Visit: Payer: Self-pay | Admitting: Medical

## 2017-02-21 ENCOUNTER — Other Ambulatory Visit: Payer: Self-pay

## 2017-02-21 NOTE — Telephone Encounter (Signed)
Can pt have a refill on this don't see that he is still taking this , also made him appt for med check.

## 2017-02-21 NOTE — Telephone Encounter (Signed)
Send refill on Edarbycholor.  He should be on this as we changed off a different medication back in 07/2016.  (see prior lab note)  He is also due back for med check at this time.   Last visit I had referred to cardiology and I don't see any notation that he had that f/u.

## 2017-02-22 NOTE — Telephone Encounter (Signed)
Pt made an appt to come to be seen

## 2017-03-07 ENCOUNTER — Ambulatory Visit (INDEPENDENT_AMBULATORY_CARE_PROVIDER_SITE_OTHER): Payer: 59 | Admitting: Medical

## 2017-03-07 ENCOUNTER — Encounter: Payer: Self-pay | Admitting: Medical

## 2017-03-07 VITALS — BP 150/92 | HR 80 | Wt 272.2 lb

## 2017-03-07 DIAGNOSIS — E785 Hyperlipidemia, unspecified: Secondary | ICD-10-CM | POA: Diagnosis not present

## 2017-03-07 DIAGNOSIS — G4733 Obstructive sleep apnea (adult) (pediatric): Secondary | ICD-10-CM

## 2017-03-07 DIAGNOSIS — I10 Essential (primary) hypertension: Secondary | ICD-10-CM

## 2017-03-07 DIAGNOSIS — E669 Obesity, unspecified: Secondary | ICD-10-CM

## 2017-03-07 DIAGNOSIS — C182 Malignant neoplasm of ascending colon: Secondary | ICD-10-CM

## 2017-03-07 DIAGNOSIS — Z0000 Encounter for general adult medical examination without abnormal findings: Secondary | ICD-10-CM | POA: Diagnosis not present

## 2017-03-07 DIAGNOSIS — Z7189 Other specified counseling: Secondary | ICD-10-CM | POA: Diagnosis not present

## 2017-03-07 DIAGNOSIS — Z7185 Encounter for immunization safety counseling: Secondary | ICD-10-CM

## 2017-03-07 NOTE — Patient Instructions (Signed)
Specific recommendations today include: Diet  Increase your water intake, get at least 64 ounces of water daily  Eat 3-4 fruits daily  Eat plenty of vegetables throughout the day, preferably each meal  Eat good sources of grains such as oatmeal, barley, whole grain pasta, whole grain bread, but limit the serving size to 1 cup of oatmeal or pasta per meal or 2 slices of bread per meal  We don't need to meat at each meal, however if you do eat meat, limit serving size to the size of your palm, and eat chicken fish or [place], lean cuts of meat  Eat beans every day as this is a good nutrient source and helps to curb appetite  Consider using a program such as Weight Watchers  Consider using a Smart phone app such as My Fitness PAL or [employer] to track your calories and progress   Things to limit or avoid:  Avoid fast food, fried foods, fatty foods  Limit sweets, ice cream, cake and other baked goods  Avoid soda, beer, alcohol, sweet tea  Exercise  You need to be exercising most days of the week for 30-45 minutes or more  Good forms of exercise include walking, hiking, stationary bike or bicycling outside, lap swimming, aerobics class, dance, Zumba  Consider getting a trainer at a gym to help with exercise  I recommend follow up here in 3 months on weight loss efforts.

## 2017-03-07 NOTE — Progress Notes (Signed)
Subjective:   HPI  Edward Patterson is a 51 y.o. male who presents for med check Chief Complaint  Patient presents with  . med check    med check,    Medical care team includes: Michela Pitcher, here for primary care Dentist Eye doctor Wilfrid Lund, GI Dr. Dalbert Batman, general surgery  Concerns: HTN - No prior echo or treadmill stress test.  Doesn't check BPs at home.  OSA - uses CPAP daily.    Compliant with medications, avoids sodium.   Uses low sodium seasoning.   Exercising with weights and treadmill  Reviewed their medical, surgical, family, social, medication, and allergy history and updated chart as appropriate.  Past Medical History:  Diagnosis Date  . Allergy    sinus  . Colon cancer ([place]) 08/2014   Stage TIII, N0, invasive adenocarcinoma of hepatic flexure/right colon s/p resection but declined medical oncology f/u  . Family history of colon cancer    mother, sister  . Hyperlipidemia   . Hypertension   . Obesity   . Obstructive sleep apnea on CPAP    CPAP    Past Surgical History:  Procedure Laterality Date  . APPENDECTOMY  08/2014   along with right colectomy  . COLONOSCOPY  10/2016   polpys, repeat in 3 years.;  Dr. Wilfrid Lund  . COLONOSCOPY WITH PROPOFOL N/A 07/13/2014   Procedure: COLONOSCOPY WITH PROPOFOL;  Surgeon: Inda Castle, MD;  Location: [facility];  Service: Endoscopy;  Laterality: N/A;  . HOT HEMOSTASIS N/A 07/13/2014   Procedure: HOT HEMOSTASIS (ARGON PLASMA COAGULATION/BICAP);  Surgeon: Inda Castle, MD;  Location: [facility];  Service: Endoscopy;  Laterality: N/A;  . LAPAROSCOPIC PARTIAL COLECTOMY N/A 08/28/2014   Procedure: LAPAROSCOPIC ASSISTED right COLECTOMY, ;  Surgeon: Fanny Skates, MD;  Location: [facility];  Service: General;  Laterality: N/A;  . WISDOM TOOTH EXTRACTION      Social History   Social History  . Marital status: Married    Spouse name: N/A  . Number of children: N/A  . Years of education: N/A    Occupational History  . Not on file.   Social History Main Topics  . Smoking status: Never Smoker  . Smokeless tobacco: Never Used  . Alcohol use No  . Drug use: No  . Sexual activity: Not on file   Other Topics Concern  . Not on file   Social History Narrative   Married, 2 children, exercise -  physically active on the job.   Housekeeping at [employer], behavioral heatlh.   Was at [employer].     Family History  Problem Relation Age of Onset  . Hypertension Mother   . Colon cancer Mother 62       died at 34  . Colon cancer Sister 29  . Diabetes Father   . Edema Sister   . Diabetes Maternal Aunt   . Cancer Maternal Uncle        type unk- died in his 11's, hx of smoking  . Heart disease Neg Hx   . Stroke Neg Hx   . Hyperlipidemia Neg Hx   . Esophageal cancer Neg Hx   . Rectal cancer Neg Hx   . Stomach cancer Neg Hx      Current Outpatient Prescriptions:  .  amLODipine (NORVASC) 10 MG tablet, Take 1 tablet (10 mg total) by mouth daily., Disp: 90 tablet, Rfl: 3 .  aspirin 81 MG tablet, Take 1 tablet (81 mg total) by mouth at  bedtime., Disp: 90 tablet, Rfl: 3 .  carvedilol (COREG) 12.5 MG tablet, Take 1 tablet (12.5 mg total) by mouth 2 (two) times daily with a meal., Disp: 180 tablet, Rfl: 3 .  EDARBYCLOR 40-25 MG TABS, TAKE 1 TABLET DAILY., Disp: 30 tablet, Rfl: 0 .  Multiple Vitamin (MULTIVITAMIN) capsule, Take 1 capsule by mouth every morning. , Disp: , Rfl:  .  Omega-3 Fatty Acids (FISH OIL PO), Take 1 capsule by mouth daily at 3 pm. , Disp: , Rfl:  .  pravastatin (PRAVACHOL) 20 MG tablet, Take 1 tablet (20 mg total) by mouth daily., Disp: 90 tablet, Rfl: 3  Current Facility-Administered Medications:  .  0.9 %  sodium chloride infusion, 500 mL, Intravenous, Continuous, Danis, Estill Cotta III, MD  No Known Allergies   Review of Systems Constitutional: -fever, -chills, -sweats, -unexpected weight change, -decreased appetite, -fatigue Allergy: -sneezing, -itching,  -congestion Dermatology: -changing moles, --rash, -lumps ENT: -runny nose, -ear pain, -sore throat, -hoarseness, -sinus pain, -teeth pain, - ringing in ears, -hearing loss, -nosebleeds Cardiology: -chest pain, -palpitations, -swelling, -difficulty breathing when lying flat, -waking up short of breath Respiratory: -cough, -shortness of breath, -difficulty breathing with exercise or exertion, -wheezing, -coughing up blood Gastroenterology: -abdominal pain, -nausea, -vomiting, -diarrhea, -constipation, -blood in stool, -changes in bowel movement, -difficulty swallowing or eating Hematology: -bleeding, -bruising  Musculoskeletal: -joint aches, -muscle aches, -joint swelling, -back pain, -neck pain, -cramping, -changes in gait Ophthalmology: denies vision changes, eye redness, itching, discharge Urology: -burning with urination, -difficulty urinating, -blood in urine, -urinary frequency, -urgency, -incontinence Neurology: -headache, -weakness, -tingling, -numbness, -memory loss, -falls, -dizziness Psychology: -depressed mood, -agitation, -sleep problems     Objective:   BP (!) 150/92   Pulse 80   Wt 272 lb 3.2 oz (123.5 kg)   BMI 39.06 kg/m   BP Readings from Last 3 Encounters:  03/07/17 (!) 150/92  10/17/16 114/62  08/02/16 (!) 144/88   Wt Readings from Last 3 Encounters:  03/07/17 272 lb 3.2 oz (123.5 kg)  10/17/16 272 lb (123.4 kg)  10/03/16 273 lb 12.8 oz (124.2 kg)    General appearance: alert, no distress, WD/WN, African American male Oral cavity: MMM, tongue normal, teeth in good repair Neck: supple, no lymphadenopathy, no thyromegaly, no masses, normal ROM, no bruits Chest: non tender, normal shape and expansion Heart: RRR, normal S1, S2, no murmurs Lungs: CTA bilaterally, no wheezes, rhonchi, or rales Extremities: no edema, no cyanosis, no clubbing Pulses: 2+ symmetric, upper and lower extremities, normal cap refill   Assessment and Plan :    Encounter Diagnoses   Name Primary?  . Hyperlipidemia, unspecified hyperlipidemia type Yes  . Essential hypertension   . OSA (obstructive sleep apnea)   . Cancer of ascending colon s/p lap assisted right colectomy 08/28/14   . Obesity with serious comorbidity, unspecified classification, unspecified obesity type   . Vaccine counseling    HTN - not at goal.   He refused med changes.  He wants to get more aggressive on lifestyle changes.   OSA - compliant with CPAP.   Hyperlipidemia - compliant with medication  Obesity - work on weight loss efforts through healthy diet, exercise.  He declines medications, referral to weight loss clinic, declines consideration of bariatric surgery.   Spent most of the visit discussing diet and weight loss strategies.  Body mass index is 39.06 kg/m.     Mackie was seen today for med check.  Diagnoses and all orders for this visit:  Hyperlipidemia, unspecified  hyperlipidemia type -     Hemoglobin A1c  Essential hypertension -     Comprehensive metabolic panel -     CBC with Differential/Platelet  OSA (obstructive sleep apnea)  Cancer of ascending colon s/p lap assisted right colectomy 08/28/14  Obesity with serious comorbidity, unspecified classification, unspecified obesity type -     Hemoglobin A1c  Vaccine counseling   Follow-up pending labs,81mo

## 2017-03-08 ENCOUNTER — Other Ambulatory Visit: Payer: Self-pay

## 2017-03-08 LAB — CBC WITH DIFFERENTIAL/PLATELET
BASOS ABS: 39 {cells}/uL (ref 0–200)
Basophils Relative: 1.1 %
EOS PCT: 2.6 %
Eosinophils Absolute: 91 cells/uL (ref 15–500)
HCT: 50 % (ref 38.5–50.0)
HEMOGLOBIN: 17.4 g/dL — AB (ref 13.2–17.1)
LYMPHS ABS: 1659 {cells}/uL (ref 850–3900)
MCH: 29.3 pg (ref 27.0–33.0)
MCHC: 34.8 g/dL (ref 32.0–36.0)
MCV: 84.3 fL (ref 80.0–100.0)
MONOS PCT: 14.1 %
MPV: 11.4 fL (ref 7.5–12.5)
NEUTROS ABS: 1218 {cells}/uL — AB (ref 1500–7800)
Neutrophils Relative %: 34.8 %
Platelets: 209 10*3/uL (ref 140–400)
RBC: 5.93 10*6/uL — AB (ref 4.20–5.80)
RDW: 13.2 % (ref 11.0–15.0)
Total Lymphocyte: 47.4 %
WBC mixed population: 494 cells/uL (ref 200–950)
WBC: 3.5 10*3/uL — AB (ref 3.8–10.8)

## 2017-03-08 LAB — COMPREHENSIVE METABOLIC PANEL
AG RATIO: 1.5 (calc) (ref 1.0–2.5)
ALKALINE PHOSPHATASE (APISO): 58 U/L (ref 40–115)
ALT: 25 U/L (ref 9–46)
AST: 30 U/L (ref 10–35)
Albumin: 4.2 g/dL (ref 3.6–5.1)
BILIRUBIN TOTAL: 1.3 mg/dL — AB (ref 0.2–1.2)
BUN: 18 mg/dL (ref 7–25)
CALCIUM: 9.4 mg/dL (ref 8.6–10.3)
CO2: 28 mmol/L (ref 20–32)
Chloride: 99 mmol/L (ref 98–110)
Creat: 1.03 mg/dL (ref 0.70–1.33)
Globulin: 2.8 g/dL (calc) (ref 1.9–3.7)
Glucose, Bld: 96 mg/dL (ref 65–99)
Potassium: 4 mmol/L (ref 3.5–5.3)
SODIUM: 136 mmol/L (ref 135–146)
Total Protein: 7 g/dL (ref 6.1–8.1)

## 2017-03-08 LAB — HEMOGLOBIN A1C
HEMOGLOBIN A1C: 5.5 %{Hb} (ref <5.7)
Mean Plasma Glucose: 111 (calc)
eAG (mmol/L): 6.2 (calc)

## 2017-03-28 ENCOUNTER — Other Ambulatory Visit: Payer: Self-pay | Admitting: Medical

## 2017-05-18 ENCOUNTER — Other Ambulatory Visit: Payer: Self-pay | Admitting: Medical

## 2017-05-23 ENCOUNTER — Ambulatory Visit: Payer: 59 | Admitting: Medical

## 2017-05-23 ENCOUNTER — Encounter: Payer: Self-pay | Admitting: Medical

## 2017-05-23 VITALS — BP 140/82 | HR 82 | Wt 255.0 lb

## 2017-05-23 DIAGNOSIS — G4733 Obstructive sleep apnea (adult) (pediatric): Secondary | ICD-10-CM | POA: Diagnosis not present

## 2017-05-23 DIAGNOSIS — E785 Hyperlipidemia, unspecified: Secondary | ICD-10-CM

## 2017-05-23 DIAGNOSIS — E669 Obesity, unspecified: Secondary | ICD-10-CM | POA: Diagnosis not present

## 2017-05-23 DIAGNOSIS — I10 Essential (primary) hypertension: Secondary | ICD-10-CM

## 2017-05-23 NOTE — Progress Notes (Signed)
Subjective: Chief Complaint  Patient presents with  . med check    med check , fasting    Here today for med check.   Since last visit he has lost weight intentionally, attributes this to extra activity working a new part-time  job at [employer] in addition to his regular job.  He is trying to eat healthy, avoiding salt   there was confusion today over his med list.  He reports that he takes 3 pills every morning   And no pills at nighttime.  He is pretty confident he is taking Edarbychlor that was started in March 2018 to replace lisinopril HCT he thinks he is taking Coreg but only once a day instead of the labeled twice a day.  And he is not taking amlodipine although this was documented as still taking March 2018.   He is pretty sure he is taking his Pravachol 20 mg every morning, not at nighttime  He has no new complaint  Past Medical History:  Diagnosis Date  . Allergy    sinus  . Colon cancer ([place]) 08/2014   Stage TIII, N0, invasive adenocarcinoma of hepatic flexure/right colon s/p resection but declined medical oncology f/u  . Family history of colon cancer    mother, sister  . Hyperlipidemia   . Hypertension   . Obesity   . Obstructive sleep apnea on CPAP    CPAP   Current Outpatient Medications on File Prior to Visit  Medication Sig Dispense Refill  . aspirin 81 MG tablet Take 1 tablet (81 mg total) by mouth at bedtime. 90 tablet 3  . EDARBYCLOR 40-25 MG TABS TAKE 1 TABLET BY MOUTH DAILY. 30 tablet 0  . Multiple Vitamin (MULTIVITAMIN) capsule Take 1 capsule by mouth every morning.     . Omega-3 Fatty Acids (FISH OIL PO) Take 1 capsule by mouth daily at 3 pm.     . pravastatin (PRAVACHOL) 20 MG tablet Take 1 tablet (20 mg total) by mouth daily. 90 tablet 3  . carvedilol (COREG) 12.5 MG tablet Take 1 tablet (12.5 mg total) by mouth 2 (two) times daily with a meal. (Patient not taking: Reported on 05/23/2017) 180 tablet 3   Current Facility-Administered  Medications on File Prior to Visit  Medication Dose Route Frequency Provider Last Rate Last Dose  . 0.9 %  sodium chloride infusion  500 mL Intravenous Continuous Danis, Estill Cotta III, MD       ROS as in subjective    Objective: BP 140/82   Pulse 82   Wt 255 lb (115.7 kg)   SpO2 97%   BMI 36.59 kg/m   Wt Readings from Last 3 Encounters:  05/23/17 255 lb (115.7 kg)  03/07/17 272 lb 3.2 oz (123.5 kg)  10/17/16 272 lb (123.4 kg)   BP Readings from Last 3 Encounters:  05/23/17 140/82  03/07/17 (!) 150/92  10/17/16 114/62   General appearence: alert, no distress, WD/WN,  Oral cavity: MMM, no lesions Neck: supple, no lymphadenopathy, no thyromegaly, no masses, no bruits Heart: RRR, normal S1, S2, no murmurs Lungs: CTA bilaterally, no wheezes, rhonchi, or rales Pulses: 2+ symmetric, upper and lower extremities, normal cap refill Ext: no edema   Assessment: Encounter Diagnoses  Name Primary?  . Essential hypertension Yes  . OSA (obstructive sleep apnea)   . Hyperlipidemia, unspecified hyperlipidemia type   . Obesity with serious comorbidity, unspecified classification, unspecified obesity type      Plan: I congratulated him on his  weight loss.  He is also trying to eat healthy.  There were no real concerns today with symptoms or issues, however there was confusion on his med list.  When he gets home he will call back and tell us exactly what he is taking for the pill bottle.  After reviewing back in his chart, we stopped lisinopril HCT and change to [facility] March 2018.  June 2017 amlodipine was added and he was already on Coreg reportedly twice a day at that time.  Somewhere along the way he must have quit taking the Coreg twice a day or never started it twice a day and was on once a day all along.  He cannot really explain what happened to the amlodipine but thinks we stopped it when we actually did not  At this point I think he just needs a minor adjustment to his  blood pressure regimen.  When he calls back we will make recommendations.  I advised he take his Pravachol at night.  Landon was seen today for med check.  Diagnoses and all orders for this visit:  Essential hypertension  OSA (obstructive sleep apnea)  Hyperlipidemia, unspecified hyperlipidemia type  Obesity with serious comorbidity, unspecified classification, unspecified obesity type

## 2017-05-28 ENCOUNTER — Telehealth: Payer: Self-pay

## 2017-05-28 NOTE — Telephone Encounter (Signed)
Pt called back to let you know what medicine he is taking carvediol twice  a day ,edarbyclor once a day , and pravastin once a day.

## 2017-05-29 ENCOUNTER — Other Ambulatory Visit: Payer: Self-pay | Admitting: Medical

## 2017-05-29 MED ORDER — PRAVASTATIN SODIUM 20 MG PO TABS: 20.0000 mg | ORAL_TABLET | Freq: Every day | ORAL | 1 refills | Status: DC

## 2017-05-29 MED ORDER — ASPIRIN 81 MG PO TABS: 81.0000 mg | ORAL_TABLET | Freq: Every day | ORAL | 3 refills | Status: DC

## 2017-05-29 MED ORDER — CARVEDILOL 12.5 MG PO TABS: 12.5000 mg | ORAL_TABLET | Freq: Two times a day (BID) | ORAL | 3 refills | Status: DC

## 2017-05-29 MED ORDER — AZILSARTAN-CHLORTHALIDONE 40-25 MG PO TABS: 1.0000 | ORAL_TABLET | Freq: Every day | ORAL | 1 refills | Status: DC

## 2017-05-29 NOTE — Telephone Encounter (Signed)
Ok.  Thanks for calling. I updated chart.    He is NOT on Amlodipine, and this was noted in chart.

## 2017-11-20 ENCOUNTER — Encounter: Payer: 59 | Admitting: Family Medicine

## 2017-12-17 ENCOUNTER — Other Ambulatory Visit: Payer: Self-pay

## 2017-12-17 MED ORDER — PRAVASTATIN SODIUM 20 MG PO TABS: 20.0000 mg | ORAL_TABLET | Freq: Every day | ORAL | 0 refills | Status: DC

## 2017-12-17 NOTE — Telephone Encounter (Signed)
Pt has a physical scheduled for 01/25/18 and needs a refill on Pravastatin.

## 2017-12-18 ENCOUNTER — Encounter: Payer: Self-pay | Admitting: Family Medicine

## 2017-12-20 ENCOUNTER — Other Ambulatory Visit: Payer: Self-pay | Admitting: Medical

## 2017-12-20 ENCOUNTER — Telehealth: Payer: Self-pay | Admitting: Medical

## 2017-12-20 MED ORDER — AZILSARTAN-CHLORTHALIDONE 40-25 MG PO TABS: 1.0000 | ORAL_TABLET | Freq: Every day | ORAL | 0 refills | Status: DC

## 2017-12-20 MED ORDER — PRAVASTATIN SODIUM 20 MG PO TABS: 20.0000 mg | ORAL_TABLET | Freq: Every day | ORAL | 0 refills | Status: DC

## 2017-12-20 MED ORDER — CARVEDILOL 12.5 MG PO TABS: 12.5000 mg | ORAL_TABLET | Freq: Two times a day (BID) | ORAL | 0 refills | Status: DC

## 2017-12-20 NOTE — Telephone Encounter (Signed)
   Patient called he needs refill on bp meds, (he does not know name of med)   [facility]

## 2017-12-24 ENCOUNTER — Telehealth: Payer: Self-pay

## 2017-12-24 MED ORDER — CARVEDILOL 12.5 MG PO TABS: 12.5000 mg | ORAL_TABLET | Freq: Two times a day (BID) | ORAL | 0 refills | Status: DC

## 2017-12-24 NOTE — Telephone Encounter (Signed)
30 day is not covered via mail order.

## 2018-01-25 ENCOUNTER — Ambulatory Visit (INDEPENDENT_AMBULATORY_CARE_PROVIDER_SITE_OTHER): Payer: 59 | Admitting: Medical

## 2018-01-25 ENCOUNTER — Encounter: Payer: Self-pay | Admitting: Medical

## 2018-01-25 VITALS — BP 160/90 | HR 80 | Temp 98.0°F | Resp 16 | Ht 70.5 in | Wt 242.0 lb

## 2018-01-25 DIAGNOSIS — C182 Malignant neoplasm of ascending colon: Secondary | ICD-10-CM

## 2018-01-25 DIAGNOSIS — Z131 Encounter for screening for diabetes mellitus: Secondary | ICD-10-CM | POA: Diagnosis not present

## 2018-01-25 DIAGNOSIS — Z125 Encounter for screening for malignant neoplasm of prostate: Secondary | ICD-10-CM

## 2018-01-25 DIAGNOSIS — E669 Obesity, unspecified: Secondary | ICD-10-CM

## 2018-01-25 DIAGNOSIS — Z7185 Encounter for immunization safety counseling: Secondary | ICD-10-CM

## 2018-01-25 DIAGNOSIS — I10 Essential (primary) hypertension: Secondary | ICD-10-CM | POA: Diagnosis not present

## 2018-01-25 DIAGNOSIS — Z0000 Encounter for general adult medical examination without abnormal findings: Secondary | ICD-10-CM | POA: Diagnosis not present

## 2018-01-25 DIAGNOSIS — Z800 Family history of malignant neoplasm of digestive organs: Secondary | ICD-10-CM

## 2018-01-25 DIAGNOSIS — E785 Hyperlipidemia, unspecified: Secondary | ICD-10-CM | POA: Diagnosis not present

## 2018-01-25 DIAGNOSIS — G4733 Obstructive sleep apnea (adult) (pediatric): Secondary | ICD-10-CM

## 2018-01-25 DIAGNOSIS — Z7189 Other specified counseling: Secondary | ICD-10-CM

## 2018-01-25 LAB — POCT URINALYSIS DIP (PROADVANTAGE DEVICE)
BILIRUBIN UA: NEGATIVE
Glucose, UA: NEGATIVE mg/dL
Ketones, POC UA: NEGATIVE mg/dL
LEUKOCYTES UA: NEGATIVE
NITRITE UA: NEGATIVE
PROTEIN UA: NEGATIVE mg/dL
RBC UA: NEGATIVE
Specific Gravity, Urine: 1.02
UUROB: NEGATIVE
pH, UA: 6 (ref 5.0–8.0)

## 2018-01-25 MED ORDER — CARVEDILOL 12.5 MG PO TABS: ORAL_TABLET | ORAL | 3 refills | Status: DC

## 2018-01-25 MED ORDER — AZILSARTAN-CHLORTHALIDONE 40-25 MG PO TABS: 1.0000 | ORAL_TABLET | Freq: Every day | ORAL | 3 refills | Status: DC

## 2018-01-25 MED ORDER — PRAVASTATIN SODIUM 20 MG PO TABS: 20.0000 mg | ORAL_TABLET | Freq: Every day | ORAL | 3 refills | Status: DC

## 2018-01-25 MED ORDER — ASPIRIN 81 MG PO TABS: 81.0000 mg | ORAL_TABLET | Freq: Every day | ORAL | 3 refills | Status: DC

## 2018-01-25 NOTE — Progress Notes (Signed)
Subjective:   HPI  Edward Patterson is a 52 y.o. male who presents for physical Chief Complaint  Patient presents with  . CPE    fasting CPE    Medical care team includes: Michela Pitcher, here for primary care Dentist Eye doctor [name], GI Dr. Dalbert Batman, general surgery  Concerns: HTN - not checking home BPs.  Ripping and running, working 2 jobs.   He ran out of BP medication recently, says that his medications aren't being sent to correct pharmacy, so he ran out of [place].    He had been compliant with Edarbychlor and Coreg.  Hyperlipidemia - compliant with Pravachol 20mg  daily and Aspirin 81mg  daily  OSA - uses CPAP daily.    Gets his flu shot free at work soon  Has some head congestion, cold symptoms started 5 days ago.  No fever.     Still working 2 jobs, staying running.     Reviewed their medical, surgical, family, social, medication, and allergy history and updated chart as appropriate.  Past Medical History:  Diagnosis Date  . Allergy    sinus  . Colon cancer ([place]) 08/2014   Stage TIII, N0, invasive adenocarcinoma of hepatic flexure/right colon s/p resection but declined medical oncology f/u  . Family history of colon cancer    mother, sister  . Hyperlipidemia   . Hypertension   . Obesity   . Obstructive sleep apnea on CPAP    CPAP    Past Surgical History:  Procedure Laterality Date  . APPENDECTOMY  08/2014   along with right colectomy  . COLONOSCOPY  10/2016   polpys, repeat in 3 years.;  Dr. Wilfrid Lund  . COLONOSCOPY WITH PROPOFOL N/A 07/13/2014   Procedure: COLONOSCOPY WITH PROPOFOL;  Surgeon: Inda Castle, MD;  Location: [facility] END[facility]COPY;  Service: Endoscopy;  Laterality: N/A;  . HOT HEMOSTASIS N/A 07/13/2014   Procedure: HOT HEMOSTASIS (ARGON PLASMA COAGULATION/BICAP);  Surgeon: Inda Castle, MD;  Location: [facility];  Service: Endoscopy;  Laterality: N/A;  . LAPAROSCOPIC PARTIAL COLECTOMY N/A 08/28/2014   Procedure: LAPAROSCOPIC  ASSISTED right COLECTOMY, ;  Surgeon: Fanny Skates, MD;  Location: [facility];  Service: General;  Laterality: N/A;  . WISDOM TOOTH EXTRACTION      Social History   Socioeconomic History  . Marital status: Married    Spouse name: Not on file  . Number of children: Not on file  . Years of education: Not on file  . Highest education level: Not on file  Occupational History  . Not on file  Social Needs  . Financial resource strain: Not on file  . Food insecurity:    Worry: Not on file    Inability: Not on file  . Transportation needs:    Medical: Not on file    Non-medical: Not on file  Tobacco Use  . Smoking status: Never Smoker  . Smokeless tobacco: Never Used  Substance and Sexual Activity  . Alcohol use: No  . Drug use: No  . Sexual activity: Not on file  Lifestyle  . Physical activity:    Days per week: Not on file    Minutes per session: Not on file  . Stress: Not on file  Relationships  . Social connections:    Talks on phone: Not on file    Gets together: Not on file    Attends religious service: Not on file    Active member of club or organization: Not on file  Attends meetings of clubs or organizations: Not on file    Relationship status: Not on file  . Intimate partner violence:    Fear of current or ex partner: Not on file    Emotionally abused: Not on file    Physically abused: Not on file    Forced sexual activity: Not on file  Other Topics Concern  . Not on file  Social History Narrative   Married, 2 children, exercise -  physically active on the job.   Housekeeping at [employer].   Was at [employer]. 01/2018    Family History  Problem Relation Age of Onset  . Hypertension Mother   . Colon cancer Mother 80       died at 17  . Colon cancer Sister 24  . Diabetes Father   . Edema Sister   . Diabetes Maternal Aunt   . Cancer Maternal Uncle        type unk- died in his 4's, hx of smoking  . Heart disease Neg Hx   . Stroke Neg Hx   .  Hyperlipidemia Neg Hx   . Esophageal cancer Neg Hx   . Rectal cancer Neg Hx   . Stomach cancer Neg Hx      Current Outpatient Medications:  .  aspirin 81 MG tablet, Take 1 tablet (81 mg total) by mouth at bedtime., Disp: 90 tablet, Rfl: 3 .  carvedilol (COREG) 12.5 MG tablet, TAKE 1 TABLET BY MOUTH 2 TIMES DAILY WITH MEALS, Disp: 180 tablet, Rfl: 3 .  Multiple Vitamin (MULTIVITAMIN) capsule, Take 1 capsule by mouth every morning. , Disp: , Rfl:  .  Omega-3 Fatty Acids (FISH OIL PO), Take 1 capsule by mouth daily at 3 pm. , Disp: , Rfl:  .  pravastatin (PRAVACHOL) 20 MG tablet, Take 1 tablet (20 mg total) by mouth daily., Disp: 90 tablet, Rfl: 3 .  Azilsartan-Chlorthalidone (EDARBYCLOR) 40-25 MG TABS, Take 1 tablet by mouth daily., Disp: 90 tablet, Rfl: 3  Current Facility-Administered Medications:  .  0.9 %  sodium chloride infusion, 500 mL, Intravenous, Continuous, Danis, Estill Cotta III, MD  No Known Allergies   Review of Systems Constitutional: -fever, -chills, -sweats, -unexpected weight change, -decreased appetite, -fatigue Allergy: -sneezing, -itching, -congestion Dermatology: -changing moles, --rash, -lumps ENT: -runny nose, -ear pain, -sore throat, -hoarseness, -sinus pain, -teeth pain, - ringing in ears, -hearing loss, -nosebleeds Cardiology: -chest pain, -palpitations, -swelling, -difficulty breathing when lying flat, -waking up short of breath Respiratory: -cough, -shortness of breath, -difficulty breathing with exercise or exertion, -wheezing, -coughing up blood Gastroenterology: -abdominal pain, -nausea, -vomiting, -diarrhea, -constipation, -blood in stool, -changes in bowel movement, -difficulty swallowing or eating Hematology: -bleeding, -bruising  Musculoskeletal: -joint aches, -muscle aches, -joint swelling, -back pain, -neck pain, -cramping, -changes in gait Ophthalmology: denies vision changes, eye redness, itching, discharge Urology: -burning with urination,  -difficulty urinating, -blood in urine, -urinary frequency, -urgency, -incontinence Neurology: -headache, -weakness, -tingling, -numbness, -memory loss, -falls, -dizziness Psychology: -depressed mood, -agitation, -sleep problems     Objective:   BP (!) 160/90   Pulse 80   Temp 98 F (36.7 C) (Oral)   Resp 16   Ht 5' 10.5" (1.791 m)   Wt 242 lb (109.8 kg)   SpO2 98%   BMI 34.23 kg/m   BP Readings from Last 3 Encounters:  01/25/18 (!) 160/90  05/23/17 140/82  03/07/17 (!) 150/92   Wt Readings from Last 3 Encounters:  01/25/18 242 lb (109.8 kg)  05/23/17  255 lb (115.7 kg)  03/07/17 272 lb 3.2 oz (123.5 kg)    General appearance: alert, no distress, WD/WN, African American male Skin: skin tags around perimeter of neck and left face, otherwise no worrisome lesions  HEENT: normocephalic, conjunctiva/corneas normal, sclerae anicteric, PERRLA, EOMi, nares patent, no discharge or erythema, pharynx normal Oral cavity: MMM, tongue normal, teeth in good repair Neck: supple, no lymphadenopathy, no thyromegaly, no masses, normal ROM, no bruits Chest: non tender, normal shape and expansion Heart: RRR, normal S1, S2, no murmurs Lungs: CTA bilaterally, no wheezes, rhonchi, or rales Abdomen: +bs, soft, lower abdominal vertical surgical scar, small reducible hernia of umbilicus, otherwise non tender, non distended, no masses, no hepatomegaly, no splenomegaly, no bruits Back: non tender, normal ROM, no scoliosis Musculoskeletal: upper extremities non tender, no obvious deformity, normal ROM throughout, lower extremities non tender, no obvious deformity, normal ROM throughout Extremities: no edema, no cyanosis, no clubbing Pulses: 2+ symmetric, upper and lower extremities, normal cap refill Neurological: alert, oriented x 3, CN2-12 intact, strength normal upper extremities and lower extremities, sensation normal throughout, DTRs 2+ throughout, no cerebellar signs, gait normal Psychiatric:  normal affect, behavior normal, pleasant  GU: normal male external genitalia,circumcised, nontender, no masses, no hernia, no lymphadenopathy Rectal: deferred   Assessment and Plan :    Encounter Diagnoses  Name Primary?  . Routine general medical examination at a health care facility Yes  . Encounter for health maintenance examination in adult   . Hyperlipidemia, unspecified hyperlipidemia type   . Essential hypertension   . OSA (obstructive sleep apnea)   . Family history of colon cancer   . Obesity with serious comorbidity, unspecified classification, unspecified obesity type   . Screening for prostate cancer   . Vaccine counseling   . Cancer of ascending colon s/p lap assisted right colectomy 08/28/14   . Screening for diabetes mellitus     Physical exam - discussed and counseled on healthy lifestyle, diet, exercise, preventative care, vaccinations, sick and well care, proper use of emergency dept and after hours care, and addressed their concerns.    Cancer screening reviewed his 2018 colonoscopy with f/u in 3 years Discussed PSA, prostate exam, and prostate cancer screening risks/benefits.     Vaccinations: Counseled on the following vaccines:  influenza and shingles Advised he check insurance coverage for shingles vaccine He will get flu shot free at work Td up to date  Acute issues discussed: none   Separate significant chronic issues discussed: HTN - not at goal.   He is somewhat noncompliant with medications.  C/t same medication but advised cardiology consult.   He will consider.  OSA - compliant with CPAP.   Hyperlipidemia - compliant with medication  Obesity - work on weight loss efforts through healthy diet, exercise  Patient Instructions  Recommendations: Shingles vaccine:  I recommend you have a shingles vaccine to help prevent shingles or herpes zoster outbreak.   Please call your insurer to inquire about coverage for the Shingrix vaccine given in 2  doses.   Some insurers cover this vaccine after age 54, some cover this after age 38.  If your insurer covers this, then call to schedule appointment to have this vaccine here.  Call insurance to inquire about coveragre for going for a heart stress test and echocardiogram.   If agreeable, I want to refer you to cardiology for this testing  Check your blood pressures 1- 2 times weekly.  Goal is 130/80 or less.  You are NOT  at goal  Get your flu shot at work and send [place] a copy of the documentation   I recommend exercising most days of the week using a type of exercise that they would enjoy and stick to such as walking, running, swimming, hiking, biking, aerobics, etc.  I recommend a healthy diet.    Do's:   whole grains such as whole grain pasta, rice, whole grains breads and whole grain cereals.  Use small quantities such as 1/2 cup per serving or 2 slices of bread per serving.    Eat 3-5 fruits daily  Eat beans at least once daily  Eat almonds in small quantities at least 3 days per week    If they eat meat, I recommend small portions of lean meats such as chicken, fish, and [place].  Eat as much NON corn and NON potato vegetables as they like, particularly raw or steamed  Drink several large glasses of water daily  Try eating 3-5 small portions daily like a deer grazing throughout the day instead of 3 regular sit down meals  Avoid relying on fast food!   Cautions:  Limit red meat  Limit corn and potatoes  Limit sweets, cake, pie, candy  Limit beer and alcohol  Avoid fried food, fast food, large portions  Avoid sugary drinks such as regular soda and sweet tea   Example: Drink mostly water throughout the day, possibly un sweet tea  Breakfast  1 piece of whole grain toast with a small amount of honey or low sugar jelly  Boiled or scrambled egg  Mid morning snack  8 almonds  handful of carrots and humus or piece of fruit  Lunch  Salad  Or   3/4 cup of brown  rice or 3/4 cup of whole grain pasta  Steamed vegetables  3-4 ounces of fish or grilled chicken if you eat meat  If no meat, eat some beans    Mid afternoon snack  Handful of vegetables and humus or 4 whole grain crackers and small amount of cheese or peanut  butter  Piece of fruit   Dinner  Similar to lunch above   Body mass index is 34.23 kg/m.    Kyran was seen today for cpe.  Diagnoses and all orders for this visit:  Routine general medical examination at a health care facility -     POCT Urinalysis DIP (Proadvantage Device) -     Comprehensive metabolic panel -     CBC with Differential/Platelet -     Lipid panel -     Hemoglobin A1c -     PSA  Encounter for health maintenance examination in adult  Hyperlipidemia, unspecified hyperlipidemia type -     Lipid panel -     Ambulatory referral to Cardiology  Essential hypertension -     Ambulatory referral to Cardiology  OSA (obstructive sleep apnea) -     Ambulatory referral to Cardiology  Family history of colon cancer  Obesity with serious comorbidity, unspecified classification, unspecified obesity type  Screening for prostate cancer -     PSA  Vaccine counseling  Cancer of ascending colon s/p lap assisted right colectomy 08/28/14  Screening for diabetes mellitus -     Hemoglobin A1c  Other orders -     Azilsartan-Chlorthalidone (EDARBYCLOR) 40-25 MG TABS; Take 1 tablet by mouth daily. -     aspirin 81 MG tablet; Take 1 tablet (81 mg total) by mouth at bedtime. -     carvedilol (  COREG) 12.5 MG tablet; TAKE 1 TABLET BY MOUTH 2 TIMES DAILY WITH MEALS -     pravastatin (PRAVACHOL) 20 MG tablet; Take 1 tablet (20 mg total) by mouth daily.   Follow-up pending labs, yearly for physical

## 2018-01-25 NOTE — Patient Instructions (Signed)
Recommendations: Shingles vaccine:  I recommend you have a shingles vaccine to help prevent shingles or herpes zoster outbreak.   Please call your insurer to inquire about coverage for the Shingrix vaccine given in 2 doses.   Some insurers cover this vaccine after age 54, some cover this after age 90.  If your insurer covers this, then call to schedule appointment to have this vaccine here.  Call insurance to inquire about coveragre for going for a heart stress test and echocardiogram.   If agreeable, I want to refer you to cardiology for this testing  Check your blood pressures 1- 2 times weekly.  Goal is 130/80 or less.  You are NOT at goal  Get your flu shot at work and send [place] a copy of the documentation   I recommend exercising most days of the week using a type of exercise that they would enjoy and stick to such as walking, running, swimming, hiking, biking, aerobics, etc.  I recommend a healthy diet.    Do's:   whole grains such as whole grain pasta, rice, whole grains breads and whole grain cereals.  Use small quantities such as 1/2 cup per serving or 2 slices of bread per serving.    Eat 3-5 fruits daily  Eat beans at least once daily  Eat almonds in small quantities at least 3 days per week    If they eat meat, I recommend small portions of lean meats such as chicken, fish, and [place].  Eat as much NON corn and NON potato vegetables as they like, particularly raw or steamed  Drink several large glasses of water daily  Try eating 3-5 small portions daily like a deer grazing throughout the day instead of 3 regular sit down meals  Avoid relying on fast food!   Cautions:  Limit red meat  Limit corn and potatoes  Limit sweets, cake, pie, candy  Limit beer and alcohol  Avoid fried food, fast food, large portions  Avoid sugary drinks such as regular soda and sweet tea   Example: Drink mostly water throughout the day, possibly un sweet tea  Breakfast  1 piece of  whole grain toast with a small amount of honey or low sugar jelly  Boiled or scrambled egg  Mid morning snack  8 almonds  handful of carrots and humus or piece of fruit  Lunch  Salad  Or   3/4 cup of brown rice or 3/4 cup of whole grain pasta  Steamed vegetables  3-4 ounces of fish or grilled chicken if you eat meat  If no meat, eat some beans    Mid afternoon snack  Handful of vegetables and humus or 4 whole grain crackers and small amount of cheese or peanut  butter  Piece of fruit   Dinner  Similar to lunch above

## 2018-01-26 LAB — COMPREHENSIVE METABOLIC PANEL
ALK PHOS: 85 IU/L (ref 39–117)
ALT: 22 IU/L (ref 0–44)
AST: 22 IU/L (ref 0–40)
Albumin/Globulin Ratio: 1.6 (ref 1.2–2.2)
Albumin: 4.4 g/dL (ref 3.5–5.5)
BUN/Creatinine Ratio: 21 — ABNORMAL HIGH (ref 9–20)
BUN: 22 mg/dL (ref 6–24)
Bilirubin Total: 1.1 mg/dL (ref 0.0–1.2)
CALCIUM: 9.2 mg/dL (ref 8.7–10.2)
CO2: 23 mmol/L (ref 20–29)
CREATININE: 1.05 mg/dL (ref 0.76–1.27)
Chloride: 96 mmol/L (ref 96–106)
GFR calc Af Amer: 93 mL/min/{1.73_m2} (ref >59)
GFR, EST NON AFRICAN AMERICAN: 80 mL/min/{1.73_m2} (ref >59)
GLOBULIN, TOTAL: 2.8 g/dL (ref 1.5–4.5)
GLUCOSE: 97 mg/dL (ref 65–99)
POTASSIUM: 4 mmol/L (ref 3.5–5.2)
Sodium: 134 mmol/L (ref 134–144)
Total Protein: 7.2 g/dL (ref 6.0–8.5)

## 2018-01-26 LAB — CBC WITH DIFFERENTIAL/PLATELET
Basophils Absolute: 0 10*3/uL (ref 0.0–0.2)
Basos: 0 %
EOS (ABSOLUTE): 0.1 10*3/uL (ref 0.0–0.4)
EOS: 3 %
Hematocrit: 49.5 % (ref 37.5–51.0)
Hemoglobin: 16.6 g/dL (ref 13.0–17.7)
IMMATURE GRANS (ABS): 0 10*3/uL (ref 0.0–0.1)
IMMATURE GRANULOCYTES: 0 %
LYMPHS: 38 %
Lymphocytes Absolute: 1.8 10*3/uL (ref 0.7–3.1)
MCH: 29.3 pg (ref 26.6–33.0)
MCHC: 33.5 g/dL (ref 31.5–35.7)
MCV: 87 fL (ref 79–97)
Monocytes Absolute: 0.7 10*3/uL (ref 0.1–0.9)
Monocytes: 15 %
NEUTROS PCT: 44 %
Neutrophils Absolute: 2.1 10*3/uL (ref 1.4–7.0)
PLATELETS: 191 10*3/uL (ref 150–450)
RBC: 5.67 x10E6/uL (ref 4.14–5.80)
RDW: 13.1 % (ref 12.3–15.4)
WBC: 4.7 10*3/uL (ref 3.4–10.8)

## 2018-01-26 LAB — HEMOGLOBIN A1C
Est. average glucose Bld gHb Est-mCnc: 114 mg/dL
Hgb A1c MFr Bld: 5.6 % (ref 4.8–5.6)

## 2018-01-26 LAB — LIPID PANEL
Chol/HDL Ratio: 2.8 ratio (ref 0.0–5.0)
Cholesterol, Total: 147 mg/dL (ref 100–199)
HDL: 53 mg/dL (ref >39)
LDL Calculated: 82 mg/dL (ref 0–99)
TRIGLYCERIDES: 61 mg/dL (ref 0–149)
VLDL CHOLESTEROL CAL: 12 mg/dL (ref 5–40)

## 2018-01-26 LAB — PSA: Prostate Specific Ag, Serum: 1 ng/mL (ref 0.0–4.0)

## 2018-01-28 ENCOUNTER — Other Ambulatory Visit: Payer: Self-pay | Admitting: Medical

## 2018-02-05 ENCOUNTER — Telehealth: Payer: Self-pay | Admitting: Medical

## 2018-02-05 NOTE — Telephone Encounter (Signed)
Pt called stating that [place] called him to let him know that his Edarbyclor 40-25mg  script was going to cost over $200. Pt requesting help to reduce this cost or to change the med to something else. Mickel Baas working on this.

## 2018-02-07 ENCOUNTER — Telehealth: Payer: Self-pay

## 2018-02-07 NOTE — Telephone Encounter (Signed)
Patient notified of appointment for Cardiology and he said will have to reschedule.Marland Kitchen

## 2018-02-07 NOTE — Telephone Encounter (Signed)
Left message on voicemail for patient to call back for Cardiology appointment info  02-11-18 at 925 on [facility] location.

## 2018-02-11 ENCOUNTER — Ambulatory Visit: Payer: 59 | Admitting: Cardiology

## 2018-02-11 NOTE — Telephone Encounter (Signed)
P.A. Carole Binning approved til 01/31/19

## 2018-02-12 ENCOUNTER — Encounter: Payer: Self-pay | Admitting: Cardiology

## 2018-02-12 NOTE — Telephone Encounter (Signed)
Called [facility] t# [phone] and cost was $139 for 90 days, they state pt can get a retail pharmacy also.  Called Rx into [facility] and they had a discount card and will be $25 a month.  Called pt and informed.

## 2018-05-04 DIAGNOSIS — H524 Presbyopia: Secondary | ICD-10-CM | POA: Diagnosis not present

## 2019-03-25 ENCOUNTER — Telehealth: Payer: Self-pay | Admitting: Medical

## 2019-03-25 NOTE — Telephone Encounter (Signed)
I receive a [facility] note where he was seen recently.   I was surprised to see that he hasn't been in recently for his yearly physical and recheck on blood pressure.  Please let him know we miss him.

## 2019-03-26 ENCOUNTER — Other Ambulatory Visit: Payer: Self-pay | Admitting: Medical

## 2019-04-14 NOTE — Telephone Encounter (Signed)
Please see message, check on this

## 2019-04-15 NOTE — Telephone Encounter (Signed)
Called and left pt a VM to check on him and call the office to schedule CPE

## 2019-04-17 ENCOUNTER — Other Ambulatory Visit: Payer: Self-pay | Admitting: Medical

## 2019-05-13 ENCOUNTER — Other Ambulatory Visit: Payer: Self-pay | Admitting: Medical

## 2019-05-28 ENCOUNTER — Encounter: Payer: Self-pay | Admitting: Medical

## 2019-06-05 ENCOUNTER — Other Ambulatory Visit: Payer: Self-pay | Admitting: Medical

## 2019-06-29 ENCOUNTER — Other Ambulatory Visit: Payer: Self-pay | Admitting: Medical

## 2019-07-09 ENCOUNTER — Encounter: Payer: Self-pay | Admitting: Medical

## 2019-07-26 ENCOUNTER — Other Ambulatory Visit: Payer: Self-pay | Admitting: Medical

## 2019-07-30 ENCOUNTER — Ambulatory Visit (INDEPENDENT_AMBULATORY_CARE_PROVIDER_SITE_OTHER): Payer: BC Managed Care – PPO | Admitting: Medical

## 2019-07-30 ENCOUNTER — Other Ambulatory Visit: Payer: Self-pay

## 2019-07-30 ENCOUNTER — Encounter: Payer: Self-pay | Admitting: Medical

## 2019-07-30 VITALS — BP 200/132 | HR 78 | Temp 98.7°F | Ht 71.0 in | Wt 250.8 lb

## 2019-07-30 DIAGNOSIS — E669 Obesity, unspecified: Secondary | ICD-10-CM

## 2019-07-30 DIAGNOSIS — E7800 Pure hypercholesterolemia, unspecified: Secondary | ICD-10-CM | POA: Diagnosis not present

## 2019-07-30 DIAGNOSIS — Z0000 Encounter for general adult medical examination without abnormal findings: Secondary | ICD-10-CM

## 2019-07-30 DIAGNOSIS — Z800 Family history of malignant neoplasm of digestive organs: Secondary | ICD-10-CM

## 2019-07-30 DIAGNOSIS — I10 Essential (primary) hypertension: Secondary | ICD-10-CM

## 2019-07-30 DIAGNOSIS — C182 Malignant neoplasm of ascending colon: Secondary | ICD-10-CM

## 2019-07-30 DIAGNOSIS — G4733 Obstructive sleep apnea (adult) (pediatric): Secondary | ICD-10-CM

## 2019-07-30 DIAGNOSIS — Z85038 Personal history of other malignant neoplasm of large intestine: Secondary | ICD-10-CM

## 2019-07-30 DIAGNOSIS — Z7189 Other specified counseling: Secondary | ICD-10-CM

## 2019-07-30 DIAGNOSIS — Z7185 Encounter for immunization safety counseling: Secondary | ICD-10-CM

## 2019-07-30 MED ORDER — EDARBYCLOR 40-25 MG PO TABS: 1.0000 | ORAL_TABLET | Freq: Every day | ORAL | 0 refills | Status: DC

## 2019-07-30 MED ORDER — CARVEDILOL 12.5 MG PO TABS: 12.5000 mg | ORAL_TABLET | Freq: Two times a day (BID) | ORAL | 3 refills | Status: DC

## 2019-07-30 MED ORDER — EDARBYCLOR 40-25 MG PO TABS: 1.0000 | ORAL_TABLET | Freq: Every day | ORAL | 3 refills | Status: DC

## 2019-07-30 NOTE — Progress Notes (Signed)
Patient was given #35 days worth of samples until Prior Auth is approved.

## 2019-07-30 NOTE — Patient Instructions (Signed)
Begin back on Azilsartan Chlorthalidone blood pressure pill daily.  Continue Carvedilol blood pressure pill.  Continue aspirin and cholesterol pill  We will call with lab results  Please see [place] yearly so you don't run out of medicaiton  Given your uncontrolled blood pressure, changes in EKG today, I recommend a referral to cardiology for consult on blood pressure.  Given that your pressure is so high today, lets have you follow up in 1 week to make sure BP is improving.  Call your sleep apnea CPAP supplier for concern.   If agreeable, we will go ahead and setup for repeat sleep study.    If your medicaiton is too expensive call back immediately.    Hypertension (high blood pressure): Hypertension, commonly called high blood pressure, is when the force of blood pumping through your arteries is too strong. Your arteries are the blood vessels that carry blood from your heart throughout your body. A blood pressure reading consists of a higher number over a lower number, such as 110/72. The higher number (systolic) is the pressure inside your arteries when your heart pumps. The lower number (diastolic) is the pressure inside your arteries when your heart relaxes. Ideally you want your blood pressure below 120/80. Hypertension forces your heart to work harder to pump blood. Your arteries may become narrow or stiff. Having hypertension puts you at risk for heart disease, stroke, and other problems.  RISK FACTORS Some risk factors for high blood pressure are controllable. Others are not.  Risk factors you cannot control include:   Race. You may be at higher risk if you are African American.  Age. Risk increases with age.  Gender. Men are at higher risk than women before age 31 years. After age 53, women are at higher risk than men. Risk factors you can control include:  Not getting enough exercise or physical activity.  Being overweight.  Getting too much fat, sugar, calories, or salt in  your diet.  Drinking too much alcohol. SIGNS AND SYMPTOMS Hypertension does not usually cause signs or symptoms. Extremely high blood pressure (hypertensive crisis) may cause headache, anxiety, shortness of breath, and nosebleed. DIAGNOSIS  To check if you have hypertension, your health care provider will measure your blood pressure while you are seated, with your arm held at the level of your heart. It should be measured at least twice using the same arm. Certain conditions can cause a difference in blood pressure between your right and left arms. A blood pressure reading that is higher than normal on one occasion does not mean that you need treatment. If one blood pressure reading is high, ask your health care provider about having it checked again. BLOOD PRESSURE STAGES Blood pressure is classified into four stages: normal, prehypertension, stage 1, and stage 2. Your blood pressure reading will be used to determine what type of treatment, if any, is necessary. Appropriate treatment options are tied to these four stages:  Normal  Systolic pressure (mm Hg): below 120.  Diastolic pressure (mm Hg): below 80. Prehypertension  Systolic pressure (mm Hg): 120 to 139.  Diastolic pressure (mm Hg): 80 to 89. Stage1  Systolic pressure (mm Hg): 140 to 159.  Diastolic pressure (mm Hg): 90 to 99. Stage2  Systolic pressure (mm Hg): 160 or above.  Diastolic pressure (mm Hg): 100 or above. RISKS RELATED TO HIGH BLOOD PRESSURE Managing your blood pressure is an important responsibility. Uncontrolled high blood pressure can lead to:  A heart attack.  A stroke.  A weakened blood vessel (aneurysm).  Heart failure.  Kidney damage.  Eye damage.  Metabolic syndrome.  Memory and concentration problems. TREATMENT  Treating high blood pressure includes making lifestyle changes and possibly taking medicine. Living a healthy lifestyle can help lower high blood pressure. You may need to change  some of your habits. Lifestyle changes may include:  Following the DASH diet. This diet is high in fruits, vegetables, and whole grains. It is low in salt, red meat, and added sugars.  Getting at least 2 hours of brisk physical activity every week.  Losing weight if necessary.  Not smoking.  Limiting alcoholic beverages.  Learning ways to reduce stress. If lifestyle changes are not enough to get your blood pressure under control, your health care provider may prescribe medicine. You may need to take more than one. Work closely with your health care provider to understand the risks and benefits. HOME CARE INSTRUCTIONS  Have your blood pressure rechecked as directed by your health care provider.   Take medicines only as directed by your health care provider. Follow the directions carefully. Blood pressure medicines must be taken as prescribed. The medicine does not work as well when you skip doses. Skipping doses also puts you at risk for problems.   Do not smoke.   Monitor your blood pressure at home as directed by your health care provider. SEEK MEDICAL CARE IF:   You think you are having a reaction to medicines taken.  You have recurrent headaches or feel dizzy.  You have swelling in your ankles.  You have trouble with your vision. SEEK IMMEDIATE MEDICAL CARE IF:  You develop a severe headache or confusion.  You have unusual weakness, numbness, or feel faint.  You have severe chest or abdominal pain.  You vomit repeatedly.  You have trouble breathing. MAKE SURE YOU:   Understand these instructions.  Will watch your condition.  Will get help right away if you are not doing well or get worse. Document Released: 05/01/2005 Document Revised: 09/15/2013 Document Reviewed: 02/21/2013 [facility] Patient Information 2015 [place], [place]. This information is not intended to replace advice given to you by your health care provider. Make sure you discuss any questions  you have with your health care provider.

## 2019-07-30 NOTE — Progress Notes (Signed)
Subjective:   HPI  Edward Patterson is a 54 y.o. male who presents for Chief Complaint  Patient presents with  . Annual Exam    with fasting labs     Patient Care Team: Destyn Parfitt, Camelia Eng, PA-C as PCP - General (Family Medicine) Sees dentist Sees eye doctor  Concerns: Works at [employer] -12:30am and has side job he does regularly.   Active on the job with exercise  Last visit 2019.   Ran out of 1 of his BP medicaiton.  Not checking bp.     Reviewed their medical, surgical, family, social, medication, and allergy history and updated chart as appropriate.  Past Medical History:  Diagnosis Date  . Allergy    sinus  . Colon cancer ([place]) 08/2014   Stage TIII, N0, invasive adenocarcinoma of hepatic flexure/right colon s/p resection but declined medical oncology f/u  . Family history of colon cancer    mother, sister  . Hyperlipidemia   . Hypertension   . Obesity   . Obstructive sleep apnea on CPAP    CPAP    Past Surgical History:  Procedure Laterality Date  . APPENDECTOMY  08/2014   along with right colectomy  . COLONOSCOPY  10/2016   polpys, repeat in 3 years.;  Dr. Wilfrid Lund  . COLONOSCOPY WITH PROPOFOL N/A 07/13/2014   Procedure: COLONOSCOPY WITH PROPOFOL;  Surgeon: Inda Castle, MD;  Location: [facility];  Service: Endoscopy;  Laterality: N/A;  . HOT HEMOSTASIS N/A 07/13/2014   Procedure: HOT HEMOSTASIS (ARGON PLASMA COAGULATION/BICAP);  Surgeon: Inda Castle, MD;  Location: [facility];  Service: Endoscopy;  Laterality: N/A;  . LAPAROSCOPIC PARTIAL COLECTOMY N/A 08/28/2014   Procedure: LAPAROSCOPIC ASSISTED right COLECTOMY, ;  Surgeon: Fanny Skates, MD;  Location: [facility];  Service: General;  Laterality: N/A;  . WISDOM TOOTH EXTRACTION      Social History   Socioeconomic History  . Marital status: Married    Spouse name: Not on file  . Number of children: Not on file  . Years of education: Not on file  . Highest education level: Not on  file  Occupational History  . Not on file  Tobacco Use  . Smoking status: Never Smoker  . Smokeless tobacco: Never Used  Substance and Sexual Activity  . Alcohol use: No  . Drug use: No  . Sexual activity: Not on file  Other Topics Concern  . Not on file  Social History Narrative   Married, 2 children, exercise -  physically active on the job.   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 Difficulty of Paying Living Expenses:   Food Insecurity:   . Worried About Charity fundraiser in the Last Year:   . Arboriculturist in the Last Year:   Transportation Needs:   . Film/video editor (Medical):   Marland Kitchen Lack of Transportation (Non-Medical):   Physical Activity:   . Days of Exercise per Week:   . Minutes of Exercise per Session:   Stress:   . Feeling of Stress :   Social Connections:   . Frequency of Communication with Friends and Family:   . Frequency of Social Gatherings with Friends and Family:   . Attends Religious Services:   . Active Member of Clubs or Organizations:   . Attends Archivist Meetings:   Marland Kitchen Marital Status:   Intimate Partner Violence:   .  Fear of Current or Ex-Partner:   . Emotionally Abused:   Marland Kitchen Physically Abused:   . Sexually Abused:     Family History  Problem Relation Age of Onset  . Hypertension Mother   . Colon cancer Mother 68       died at 12  . Colon cancer Sister 33  . Diabetes Father   . Edema Sister   . Diabetes Maternal Aunt   . Cancer Maternal Uncle        type unk- died in his 2's, hx of smoking  . Heart disease Neg Hx   . Stroke Neg Hx   . Hyperlipidemia Neg Hx   . Esophageal cancer Neg Hx   . Rectal cancer Neg Hx   . Stomach cancer Neg Hx      Current Outpatient Medications:  .  aspirin 81 MG tablet, Take 1 tablet (81 mg total) by mouth at bedtime., Disp: 90 tablet, Rfl: 3 .  carvedilol (COREG) 12.5 MG tablet, Take 1 tablet (12.5 mg total) by mouth 2 (two)  times daily with a meal., Disp: 180 tablet, Rfl: 3 .  Multiple Vitamin (MULTIVITAMIN) capsule, Take 1 capsule by mouth every morning. , Disp: , Rfl:  .  Omega-3 Fatty Acids (FISH OIL PO), Take 1 capsule by mouth daily at 3 pm. , Disp: , Rfl:  .  pravastatin (PRAVACHOL) 20 MG tablet, TAKE 1 TABLET BY MOUTH EVERY DAY, Disp: 30 tablet, Rfl: 0 .  Azilsartan-Chlorthalidone (EDARBYCLOR) 40-25 MG TABS, Take 1 tablet by mouth daily., Disp: 90 tablet, Rfl: 3  Current Facility-Administered Medications:  .  0.9 %  sodium chloride infusion, 500 mL, Intravenous, Continuous, Danis, Estill Cotta III, MD  No Known Allergies    Review of Systems Constitutional: -fever, -chills, -sweats, -unexpected weight change, -decreased appetite, -fatigue Allergy: -sneezing, -itching, -congestion Dermatology: -changing moles, --rash, -lumps ENT: -runny nose, -ear pain, -sore throat, -hoarseness, -sinus pain, -teeth pain, - ringing in ears, -hearing loss, -nosebleeds Cardiology: -chest pain, -palpitations, -swelling, -difficulty breathing when lying flat, -waking up short of breath Respiratory: -cough, -shortness of breath, -difficulty breathing with exercise or exertion, -wheezing, -coughing up blood Gastroenterology: -abdominal pain, -nausea, -vomiting, -diarrhea, -constipation, -blood in stool, -changes in bowel movement, -difficulty swallowing or eating Hematology: -bleeding, -bruising  Musculoskeletal: -joint aches, -muscle aches, -joint swelling, -back pain, -neck pain, -cramping, -changes in gait Ophthalmology: denies vision changes, eye redness, itching, discharge Urology: -burning with urination, -difficulty urinating, -blood in urine, -urinary frequency, -urgency, -incontinence Neurology: -headache, -weakness, -tingling, -numbness, -memory loss, -falls, -dizziness Psychology: -depressed mood, -agitation, -sleep problems Male GU: no testicular mass, pain, no lymph nodes swollen, no swelling, no rash.      Objective:  BP (!) 200/132   Pulse 78   Temp 98.7 F (37.1 C)   Ht 5\' 11"  (1.803 m)   Wt 250 lb 12.8 oz (113.8 kg)   SpO2 96%   BMI 34.98 kg/m   General appearance: alert, no distress, WD/WN, African American male Skin: several skin tags circumferential around neck, no other worrisome lesions Neck: supple, no lymphadenopathy, no thyromegaly, no masses, normal ROM, no bruits Chest: non tender, normal shape and expansion Heart: RRR, normal S1, S2, no murmurs Lungs: CTA bilaterally, no wheezes, rhonchi, or rales Abdomen: +bs, soft, abdominal central surgical scars, non tender, non distended, no masses, no hepatomegaly, no splenomegaly, no bruits Back: non tender, normal ROM, no scoliosis Musculoskeletal: upper extremities non tender, no obvious deformity, normal ROM throughout, lower extremities non tender, no  obvious deformity, normal ROM throughout Extremities: no edema, no cyanosis, no clubbing Pulses: 2+ symmetric, upper and lower extremities, normal cap refill Neurological: alert, oriented x 3, CN2-12 intact, strength normal upper extremities and lower extremities, sensation normal throughout, DTRs 2+ throughout, no cerebellar signs, gait normal Psychiatric: normal affect, behavior normal, pleasant  GU: normal male external genitalia,circumcised, nontender, no masses, no hernia, no lymphadenopathy Rectal: anus normal tone, prostate WNL  EKG - indication uncontrolled hypertension, physical.   Rate 70 bpm, pr 154ms, QRS 72ms, QTC 399, axis 67 degrees, LVH criteria based on voltage compared to 2018 EKG    Assessment and Plan :   Encounter Diagnoses  Name Primary?  . Routine general medical examination at a health care facility Yes  . Essential hypertension   . OSA (obstructive sleep apnea)   . Cancer of ascending colon s/p lap assisted right colectomy 08/28/14   . Obesity with serious comorbidity, unspecified classification, unspecified obesity type   . Family history of colon  cancer   . History of colon cancer, stage III   . Pure hypercholesterolemia   . Vaccine counseling     Physical exam - discussed and counseled on healthy lifestyle, diet, exercise, preventative care, vaccinations, sick and well care, proper use of emergency dept and after hours care, and addressed their concerns.    Health screening: See your eye doctor yearly for routine vision care. See your dentist yearly for routine dental care including hygiene visits twice yearly.  Cancer screening Colonoscopy:  Due now for repeat with GI.  Discussed PSA, prostate exam, and prostate cancer screening risks/benefits.     Vaccinations: Advised yearly influenza vaccine Counseled on getting Shingrix and covid. He will consider.   Separate significant issues discussed: Uncontrolled hypertension - he declined cardiology consult 2019, hasn't been back in over a year so ran out of one medication.   Counseled on risks on uncontrolled hypertension.  Discussed goals of therapy, low salt diet, exercise, and that it may take multiple medications to get to goal.   He again seems reluctant to go to cardiology although advised today.   F/u pending labs.   Obesity - counseled on need for weight loss, healthy lifestyle  hyperlipidemia - c/t statin, labs today   Jareth was seen today for annual exam.  Diagnoses and all orders for this visit:  Routine general medical examination at a health care facility -     Comprehensive metabolic panel -     CBC with Differential/Platelet -     PSA -     Lipid panel -     EKG 12-Lead  Essential hypertension -     Comprehensive metabolic panel -     CBC with Differential/Platelet -     EKG 12-Lead  OSA (obstructive sleep apnea)  Cancer of ascending colon s/p lap assisted right colectomy 08/28/14  Obesity with serious comorbidity, unspecified classification, unspecified obesity type  Family history of colon cancer  History of colon cancer, stage III  Pure  hypercholesterolemia  Vaccine counseling  Other orders -     carvedilol (COREG) 12.5 MG tablet; Take 1 tablet (12.5 mg total) by mouth 2 (two) times daily with a meal. -     Azilsartan-Chlorthalidone (EDARBYCLOR) 40-25 MG TABS; Take 1 tablet by mouth daily.   Follow-up pending labs, yearly for physical

## 2019-07-31 ENCOUNTER — Other Ambulatory Visit: Payer: Self-pay | Admitting: Medical

## 2019-07-31 LAB — LIPID PANEL
Chol/HDL Ratio: 2.7 ratio (ref 0.0–5.0)
Cholesterol, Total: 138 mg/dL (ref 100–199)
HDL: 51 mg/dL (ref >39)
LDL Chol Calc (NIH): 71 mg/dL (ref 0–99)
Triglycerides: 86 mg/dL (ref 0–149)
VLDL Cholesterol Cal: 16 mg/dL (ref 5–40)

## 2019-07-31 LAB — COMPREHENSIVE METABOLIC PANEL
ALT: 26 IU/L (ref 0–44)
AST: 27 IU/L (ref 0–40)
Albumin/Globulin Ratio: 1.8 (ref 1.2–2.2)
Albumin: 4.4 g/dL (ref 3.8–4.9)
Alkaline Phosphatase: 92 IU/L (ref 39–117)
BUN/Creatinine Ratio: 13 (ref 9–20)
BUN: 13 mg/dL (ref 6–24)
Bilirubin Total: 1.4 mg/dL — ABNORMAL HIGH (ref 0.0–1.2)
CO2: 24 mmol/L (ref 20–29)
Calcium: 9.3 mg/dL (ref 8.7–10.2)
Chloride: 104 mmol/L (ref 96–106)
Creatinine, Ser: 0.97 mg/dL (ref 0.76–1.27)
GFR calc Af Amer: 101 mL/min/{1.73_m2} (ref >59)
GFR calc non Af Amer: 88 mL/min/{1.73_m2} (ref >59)
Globulin, Total: 2.4 g/dL (ref 1.5–4.5)
Glucose: 101 mg/dL — ABNORMAL HIGH (ref 65–99)
Potassium: 4.5 mmol/L (ref 3.5–5.2)
Sodium: 141 mmol/L (ref 134–144)
Total Protein: 6.8 g/dL (ref 6.0–8.5)

## 2019-07-31 LAB — CBC WITH DIFFERENTIAL/PLATELET
Basophils Absolute: 0 10*3/uL (ref 0.0–0.2)
Basos: 1 %
EOS (ABSOLUTE): 0.1 10*3/uL (ref 0.0–0.4)
Eos: 3 %
Hematocrit: 52.2 % — ABNORMAL HIGH (ref 37.5–51.0)
Hemoglobin: 17.8 g/dL — ABNORMAL HIGH (ref 13.0–17.7)
Immature Grans (Abs): 0 10*3/uL (ref 0.0–0.1)
Immature Granulocytes: 0 %
Lymphocytes Absolute: 1.5 10*3/uL (ref 0.7–3.1)
Lymphs: 46 %
MCH: 29.8 pg (ref 26.6–33.0)
MCHC: 34.1 g/dL (ref 31.5–35.7)
MCV: 87 fL (ref 79–97)
Monocytes Absolute: 0.4 10*3/uL (ref 0.1–0.9)
Monocytes: 13 %
Neutrophils Absolute: 1.2 10*3/uL — ABNORMAL LOW (ref 1.4–7.0)
Neutrophils: 37 %
Platelets: 184 10*3/uL (ref 150–450)
RBC: 5.98 x10E6/uL — ABNORMAL HIGH (ref 4.14–5.80)
RDW: 13.2 % (ref 11.6–15.4)
WBC: 3.2 10*3/uL — ABNORMAL LOW (ref 3.4–10.8)

## 2019-07-31 LAB — PSA: Prostate Specific Ag, Serum: 1.1 ng/mL (ref 0.0–4.0)

## 2019-07-31 MED ORDER — CARVEDILOL 12.5 MG PO TABS: 12.5000 mg | ORAL_TABLET | Freq: Two times a day (BID) | ORAL | 3 refills | Status: DC

## 2019-07-31 MED ORDER — PRAVASTATIN SODIUM 20 MG PO TABS: 20.0000 mg | ORAL_TABLET | Freq: Every day | ORAL | 3 refills | Status: DC

## 2019-07-31 MED ORDER — ASPIRIN EC 81 MG PO TBEC: 81.0000 mg | DELAYED_RELEASE_TABLET | Freq: Every day | ORAL | 3 refills | Status: DC

## 2019-07-31 MED ORDER — EDARBYCLOR 40-25 MG PO TABS: 1.0000 | ORAL_TABLET | Freq: Every day | ORAL | 3 refills | Status: DC

## 2019-08-01 ENCOUNTER — Telehealth: Payer: Self-pay

## 2019-08-01 ENCOUNTER — Other Ambulatory Visit: Payer: Self-pay | Admitting: Medical

## 2019-08-01 MED ORDER — IRBESARTAN-HYDROCHLOROTHIAZIDE 300-12.5 MG PO TABS: 1.0000 | ORAL_TABLET | Freq: Every day | ORAL | 0 refills | Status: DC

## 2019-08-01 NOTE — Telephone Encounter (Signed)
I submitted a PA for the pts. Edarbyclor (azilsartan-chlorthalidone) and it was denied by his insurance company he had to have tried and failed 3 of the covered alternatives, the list of covered alternatives are candesartan-hctz, irbesartan-hctz, Production assistant, radio, olmesaratn/hctz, Brewing technologist, and valsartan/hctz. If you want to change his med to one of the covered alternatives.

## 2019-08-01 NOTE — Telephone Encounter (Signed)
Pt. Aware of medication change due to insurance not covering his other medication.

## 2019-08-01 NOTE — Telephone Encounter (Signed)
I changed to [place].  Let him know of insurance requirements for this

## 2019-09-25 ENCOUNTER — Other Ambulatory Visit: Payer: Self-pay | Admitting: Medical

## 2019-11-21 ENCOUNTER — Ambulatory Visit (AMBULATORY_SURGERY_CENTER): Payer: Self-pay | Admitting: *Deleted

## 2019-11-21 ENCOUNTER — Other Ambulatory Visit: Payer: Self-pay

## 2019-11-21 DIAGNOSIS — Z85038 Personal history of other malignant neoplasm of large intestine: Secondary | ICD-10-CM

## 2019-11-21 NOTE — Progress Notes (Signed)

## 2019-11-24 ENCOUNTER — Encounter: Payer: Self-pay | Admitting: Gastroenterology

## 2019-12-01 ENCOUNTER — Other Ambulatory Visit: Payer: Self-pay | Admitting: Medical

## 2019-12-02 ENCOUNTER — Ambulatory Visit (AMBULATORY_SURGERY_CENTER): Payer: BC Managed Care – PPO | Admitting: Gastroenterology

## 2019-12-02 ENCOUNTER — Encounter: Payer: Self-pay | Admitting: Gastroenterology

## 2019-12-02 ENCOUNTER — Other Ambulatory Visit: Payer: Self-pay

## 2019-12-02 VITALS — BP 149/98 | HR 75 | Temp 97.1°F | Resp 15 | Ht 71.0 in | Wt 242.0 lb

## 2019-12-02 DIAGNOSIS — Z86010 Personal history of colonic polyps: Secondary | ICD-10-CM | POA: Diagnosis present

## 2019-12-02 DIAGNOSIS — D123 Benign neoplasm of transverse colon: Secondary | ICD-10-CM

## 2019-12-02 MED ORDER — SODIUM CHLORIDE 0.9 % IV SOLN: 500.0000 mL | Freq: Once | INTRAVENOUS | Status: DC

## 2019-12-02 NOTE — Patient Instructions (Signed)
Handouts provided on polyps, diverticulosis and hemorrhoids.   YOU HAD AN ENDOSCOPIC PROCEDURE TODAY AT THE [facility]:   Refer to the procedure report that was given to you for any specific questions about what was found during the examination.  If the procedure report does not answer your questions, please call your gastroenterologist to clarify.  If you requested that your care partner not be given the details of your procedure findings, then the procedure report has been included in a sealed envelope for you to review at your convenience later.  YOU SHOULD EXPECT: Some feelings of bloating in the abdomen. Passage of more gas than usual.  Walking can help get rid of the air that was put into your GI tract during the procedure and reduce the bloating. If you had a lower endoscopy (such as a colonoscopy or flexible sigmoidoscopy) you may notice spotting of blood in your stool or on the toilet paper. If you underwent a bowel prep for your procedure, you may not have a normal bowel movement for a few days.  Please Note:  You might notice some irritation and congestion in your nose or some drainage.  This is from the oxygen used during your procedure.  There is no need for concern and it should clear up in a day or so.  SYMPTOMS TO REPORT IMMEDIATELY:   Following lower endoscopy (colonoscopy or flexible sigmoidoscopy):  Excessive amounts of blood in the stool  Significant tenderness or worsening of abdominal pains  Swelling of the abdomen that is new, acute  Fever of 100F or higher   For urgent or emergent issues, a gastroenterologist can be reached at any hour by calling [phone]. Do not use MyChart messaging for urgent concerns.    DIET:  We do recommend a small meal at first, but then you may proceed to your regular diet.  Drink plenty of fluids but you should avoid alcoholic beverages for 24 hours.  ACTIVITY:  You should plan to take it easy for the rest of today and  you should NOT DRIVE or use heavy machinery until tomorrow (because of the sedation medicines used during the test).    FOLLOW UP: Our staff will call the number listed on your records 48-72 hours following your procedure to check on you and address any questions or concerns that you may have regarding the information given to you following your procedure. If we do not reach you, we will leave a message.  We will attempt to reach you two times.  During this call, we will ask if you have developed any symptoms of COVID 19. If you develop any symptoms (ie: fever, flu-like symptoms, shortness of breath, cough etc.) before then, please call [phone].  If you test positive for Covid 19 in the 2 weeks post procedure, please call and report this information to us.    If any biopsies were taken you will be contacted by phone or by letter within the next 1-3 weeks.  Please call us at [phone] if you have not heard about the biopsies in 3 weeks.    SIGNATURES/CONFIDENTIALITY: You and/or your care partner have signed paperwork which will be entered into your electronic medical record.  These signatures attest to the fact that that the information above on your After Visit Summary has been reviewed and is understood.  Full responsibility of the confidentiality of this discharge information lies with you and/or your care-partner.  

## 2019-12-02 NOTE — Progress Notes (Signed)
PT taken to PACU. Monitors in place. VSS. Report given to RN. 

## 2019-12-02 NOTE — Progress Notes (Signed)
Called to room to assist during endoscopic procedure.  Patient ID and intended procedure confirmed with present staff. Received instructions for my participation in the procedure from the performing physician.  

## 2019-12-02 NOTE — Progress Notes (Signed)
Pt's states no medical or surgical changes since previsit or office visit.  V/S-CW  CHECK-IN-JB  Made CRNA Aware that pt. B/p elevate 187/143 rt arm after laying down,upon entering admission  B/p was 207/127,pt. Did take b/p medication this am stated " every time he walk in a  hospital his b/p shoots up."

## 2019-12-02 NOTE — Op Note (Signed)
[name] Patient Name: Edward Patterson Procedure Date: 12/02/2019 7:08 AM MRN: [id number] Endoscopist: Mallie Mussel L. Loletha Carrow , MD Age: 54 Referring MD:  Date of Birth: [birth year] Gender: Male Account #: [id number] Procedure:                Colonoscopy Indications:              Surveillance: Personal history of adenomatous                            polyps on last colonoscopy 3 years ago (diminutive                            TA and HP polyps 10/2016 on first colonoscopy after                            right hemicolectomy for colon cancer in 2016) Medicines:                Monitored Anesthesia Care Procedure:                Pre-Anesthesia Assessment:                           - Prior to the procedure, a History and Physical                            was performed, and patient medications and                            allergies were reviewed. The patient's tolerance of                            previous anesthesia was also reviewed. The risks                            and benefits of the procedure and the sedation                            options and risks were discussed with the patient.                            All questions were answered, and informed consent                            was obtained. Prior Anticoagulants: The patient has                            taken no previous anticoagulant or antiplatelet                            agents except for aspirin. ASA Grade Assessment: II                            - A patient with mild systemic disease. After  reviewing the risks and benefits, the patient was                            deemed in satisfactory condition to undergo the                            procedure.                           After obtaining informed consent, the colonoscope                            was passed under direct vision. Throughout the                            procedure, the patient's blood pressure, pulse, and                             oxygen saturations were monitored continuously. The                            [id number] was introduced through the anus and                            advanced to the the ileocolonic anastomosis. The                            colonoscopy was somewhat difficult due to a                            redundant colon and respiratory motion. The patient                            tolerated the procedure fairly well. The quality of                            the bowel preparation was good. The rectum and                            ileo-colic anastomosis were photographed. The bowel                            preparation used was Miralax. Scope In: 8:12:35 AM Scope Out: 8:50:01 AM Scope Withdrawal Time: 0 hours 33 minutes 10 seconds  Total Procedure Duration: 0 hours 37 minutes 26 seconds  Findings:                 The perianal and digital rectal examinations were                            normal.                           There was evidence of a prior side-to-side  ileo-colonic anastomosis at the hepatic flexure.                            This was patent and was characterized by healthy                            appearing mucosa.                           A 16-18 mm polyp was found in the distal transverse                            colon. The polyp was sessile. The polyp was removed                            with a piecemeal technique using a hot and cold                            snare. Resection and retrieval were complete. Area                            was tattooed with an injection of 0.5 mL of Spot                            (carbon black).                           A 3 mm polyp was found in the distal transverse                            colon. The polyp was sessile. The polyp was removed                            with a cold snare. Resection and retrieval were                            complete.                            Multiple diverticula were found in the left colon.                           Internal hemorrhoids were found.                           The exam was otherwise without abnormality on                            direct and retroflexion views. Complications:            No immediate complications. Estimated Blood Loss:     Estimated blood loss was minimal. Impression:               - Patent side-to-side ileo-colonic anastomosis,  characterized by healthy appearing mucosa.                           - One 16-18 mm polyp in the distal transverse                            colon, removed piecemeal using a hot snare.                            Resected and retrieved. Tattooed.                           - One 3 mm polyp in the distal transverse colon,                            removed with a cold snare. Resected and retrieved.                           - Diverticulosis in the left colon.                           - Internal hemorrhoids.                           - The examination was otherwise normal on direct                            and retroflexion views. Recommendation:           - Patient has a contact number available for                            emergencies. The signs and symptoms of potential                            delayed complications were discussed with the                            patient. Return to normal activities tomorrow.                            Written discharge instructions were provided to the                            patient.                           - Resume previous diet.                           - Continue present medications.                           - Await pathology results.                           - Repeat  colonoscopy in 1 year for surveillance. Stephany Poorman L. Loletha Carrow, MD 12/02/2019 8:59:58 AM This report has been signed electronically.

## 2019-12-04 ENCOUNTER — Telehealth: Payer: Self-pay

## 2019-12-04 NOTE — Telephone Encounter (Signed)
Pt states he is doing well for the time being

## 2019-12-04 NOTE — Telephone Encounter (Signed)
  Follow up Call-  Call back number 12/02/2019  Post procedure Call Back phone  # [phone]  Permission to leave phone message Yes  Some recent data might be hidden     Patient questions:  Do you have a fever, pain , or abdominal swelling? No. Pain Score  0 *  Have you tolerated food without any problems? Yes.    Have you been able to return to your normal activities? Yes.    Do you have any questions about your discharge instructions: Diet   No. Medications  No. Follow up visit  No.  Do you have questions or concerns about your Care? No.  Actions: * If pain score is 4 or above: No action needed, pain <4. 1. Have you developed a fever since your procedure? no  2.   Have you had an respiratory symptoms (SOB or cough) since your procedure? no  3.   Have you tested positive for COVID 19 since your procedure no  4.   Have you had any family members/close contacts diagnosed with the COVID 19 since your procedure?  no   If yes to any of these questions please route to Joylene John, RN and Erenest Rasher, RN

## 2019-12-04 NOTE — Telephone Encounter (Signed)
First post procedure follow up call, no answer 

## 2019-12-08 ENCOUNTER — Encounter: Payer: Self-pay | Admitting: Gastroenterology

## 2019-12-13 ENCOUNTER — Other Ambulatory Visit: Payer: Self-pay | Admitting: Medical

## 2019-12-15 NOTE — Telephone Encounter (Signed)
Is this okay to refill? 

## 2020-06-14 ENCOUNTER — Other Ambulatory Visit: Payer: Self-pay | Admitting: Medical

## 2020-07-30 ENCOUNTER — Encounter: Payer: BC Managed Care – PPO | Admitting: Medical

## 2020-08-02 ENCOUNTER — Ambulatory Visit (INDEPENDENT_AMBULATORY_CARE_PROVIDER_SITE_OTHER): Payer: BC Managed Care – PPO | Admitting: Medical

## 2020-08-02 ENCOUNTER — Encounter: Payer: Self-pay | Admitting: Medical

## 2020-08-02 ENCOUNTER — Other Ambulatory Visit: Payer: Self-pay

## 2020-08-02 VITALS — BP 174/108 | HR 79 | Ht 71.0 in | Wt 244.6 lb

## 2020-08-02 DIAGNOSIS — Z7185 Encounter for immunization safety counseling: Secondary | ICD-10-CM

## 2020-08-02 DIAGNOSIS — Z0000 Encounter for general adult medical examination without abnormal findings: Secondary | ICD-10-CM | POA: Diagnosis not present

## 2020-08-02 DIAGNOSIS — Z6834 Body mass index (BMI) 34.0-34.9, adult: Secondary | ICD-10-CM | POA: Insufficient documentation

## 2020-08-02 DIAGNOSIS — D582 Other hemoglobinopathies: Secondary | ICD-10-CM | POA: Insufficient documentation

## 2020-08-02 DIAGNOSIS — E7800 Pure hypercholesterolemia, unspecified: Secondary | ICD-10-CM

## 2020-08-02 DIAGNOSIS — Z85038 Personal history of other malignant neoplasm of large intestine: Secondary | ICD-10-CM

## 2020-08-02 DIAGNOSIS — Z800 Family history of malignant neoplasm of digestive organs: Secondary | ICD-10-CM

## 2020-08-02 DIAGNOSIS — Z131 Encounter for screening for diabetes mellitus: Secondary | ICD-10-CM | POA: Insufficient documentation

## 2020-08-02 DIAGNOSIS — D72819 Decreased white blood cell count, unspecified: Secondary | ICD-10-CM | POA: Insufficient documentation

## 2020-08-02 DIAGNOSIS — C182 Malignant neoplasm of ascending colon: Secondary | ICD-10-CM | POA: Diagnosis not present

## 2020-08-02 DIAGNOSIS — G4733 Obstructive sleep apnea (adult) (pediatric): Secondary | ICD-10-CM

## 2020-08-02 DIAGNOSIS — Z125 Encounter for screening for malignant neoplasm of prostate: Secondary | ICD-10-CM

## 2020-08-02 DIAGNOSIS — I10 Essential (primary) hypertension: Secondary | ICD-10-CM | POA: Diagnosis not present

## 2020-08-02 NOTE — Progress Notes (Signed)
Subjective:   HPI  Edward Patterson is a 55 y.o. male who presents for Chief Complaint  Patient presents with  . Annual Exam    Physical with fasting labs    Patient Care Team: Diron Haddon, Leward Quan as PCP - General (Family Medicine) Sees dentist Sees eye doctor Dr. Wilfrid Lund, gastroenterology Genetic counseling, Ferol Luz Dr. Donneta Romberg, oncology Dr. Fanny Skates, general surgery   Concerns: HTN -compliant with medication.  Not checking home blood pressures.  He attributes his high blood pressure to working 2 full-time jobs.  He only gets about 4 to 5 hours of sleep per night  OSA-she has CPAP broke.  He says he never got a call back about getting new CPAP supplies.  Hyperlipidemia-compliant with medication without complaint  Otherwise normal state of health   Reviewed their medical, surgical, family, social, medication, and allergy history and updated chart as appropriate.  Past Medical History:  Diagnosis Date  . Allergy    sinus  . Colon cancer ([place]) 08/2014   Stage TIII, N0, invasive adenocarcinoma of hepatic flexure/right colon s/p resection but declined medical oncology f/u  . Family history of colon cancer    mother, sister  . Hyperlipidemia   . Hypertension   . Obesity   . Obstructive sleep apnea on CPAP    CPAP  . Sleep apnea     Past Surgical History:  Procedure Laterality Date  . APPENDECTOMY  08/2014   along with right colectomy  . COLONOSCOPY  10/2016   polpys, repeat in 3 years.;  Dr. Wilfrid Lund  . COLONOSCOPY WITH PROPOFOL N/A 07/13/2014   Procedure: COLONOSCOPY WITH PROPOFOL;  Surgeon: Inda Castle, MD;  Location: [facility];  Service: Endoscopy;  Laterality: N/A;  . HOT HEMOSTASIS N/A 07/13/2014   Procedure: HOT HEMOSTASIS (ARGON PLASMA COAGULATION/BICAP);  Surgeon: Inda Castle, MD;  Location: [facility];  Service: Endoscopy;  Laterality: N/A;  . LAPAROSCOPIC PARTIAL COLECTOMY N/A 08/28/2014   Procedure: LAPAROSCOPIC  ASSISTED right COLECTOMY, ;  Surgeon: Fanny Skates, MD;  Location: [facility];  Service: General;  Laterality: N/A;  . WISDOM TOOTH EXTRACTION      Family History  Problem Relation Age of Onset  . Hypertension Mother   . Colon cancer Mother 37       died at 52  . Colon cancer Sister 55  . Diabetes Father   . Edema Sister   . Diabetes Maternal Aunt   . Cancer Maternal Uncle        type unk- died in his 94's, hx of smoking  . Heart disease Neg Hx   . Stroke Neg Hx   . Hyperlipidemia Neg Hx   . Esophageal cancer Neg Hx   . Rectal cancer Neg Hx   . Stomach cancer Neg Hx      Current Outpatient Medications:  .  aspirin EC 81 MG tablet, Take 1 tablet (81 mg total) by mouth daily., Disp: 90 tablet, Rfl: 3 .  carvedilol (COREG) 12.5 MG tablet, Take 1 tablet (12.5 mg total) by mouth 2 (two) times daily with a meal., Disp: 180 tablet, Rfl: 3 .  irbesartan-hydrochlorothiazide (AVALIDE) 300-12.5 MG tablet, TAKE 1 TABLET BY MOUTH EVERY DAY, Disp: 90 tablet, Rfl: 0 .  Multiple Vitamin (MULTIVITAMIN) capsule, Take 1 capsule by mouth every morning. , Disp: , Rfl:  .  Omega-3 Fatty Acids (FISH OIL PO), Take 1 capsule by mouth daily at 3 pm. , Disp: , Rfl:  .  pravastatin (  PRAVACHOL) 20 MG tablet, Take 1 tablet (20 mg total) by mouth daily., Disp: 90 tablet, Rfl: 3  No Known Allergies     Review of Systems Constitutional: -fever, -chills, -sweats, -unexpected weight change, -decreased appetite, -fatigue Allergy: -sneezing, -itching, -congestion Dermatology: -changing moles, --rash, -lumps ENT: -runny nose, -ear pain, -sore throat, -hoarseness, -sinus pain, -teeth pain, - ringing in ears, -hearing loss, -nosebleeds Cardiology: -chest pain, -palpitations, -swelling, -difficulty breathing when lying flat, -waking up short of breath Respiratory: -cough, -shortness of breath, -difficulty breathing with exercise or exertion, -wheezing, -coughing up blood Gastroenterology: -abdominal pain, -nausea,  -vomiting, -diarrhea, -constipation, -blood in stool, -changes in bowel movement, -difficulty swallowing or eating Hematology: -bleeding, -bruising  Musculoskeletal: -joint aches, -muscle aches, -joint swelling, -back pain, -neck pain, -cramping, -changes in gait Ophthalmology: denies vision changes, eye redness, itching, discharge Urology: -burning with urination, -difficulty urinating, -blood in urine, -urinary frequency, -urgency, -incontinence Neurology: -headache, -weakness, -tingling, -numbness, -memory loss, -falls, -dizziness Psychology: -depressed mood, -agitation, -sleep problems Male GU: no testicular mass, pain, no lymph nodes swollen, no swelling, no rash.     Objective:  BP (!) 184/98   Pulse 79   Ht 5\' 11"  (1.803 m)   Wt 244 lb 9.6 oz (110.9 kg)   SpO2 95%   BMI 34.11 kg/m   Wt Readings from Last 3 Encounters:  08/02/20 244 lb 9.6 oz (110.9 kg)  12/02/19 242 lb (109.8 kg)  07/30/19 250 lb 12.8 oz (113.8 kg)   BP Readings from Last 3 Encounters:  08/02/20 (!) 184/98  12/02/19 (!) 149/98  07/30/19 (!) 200/132    General appearance: alert, no distress, WD/WN, African American male Skin: Scattered skin tags along left and right lateral neck as well as posterior neck, small sebaceous cyst left upper back less than half centimeter diameter HEENT: normocephalic, conjunctiva/corneas normal, sclerae anicteric, PERRLA, EOMi, nares patent, no discharge or erythema, pharynx normal Oral cavity: MMM, tongue normal, teeth normal Neck: supple, no lymphadenopathy, no thyromegaly, no masses, normal ROM, no bruits Chest: non tender, normal shape and expansion Heart: RRR, normal S1, S2, no murmurs Lungs: CTA bilaterally, no wheezes, rhonchi, or rales Abdomen: +bs, soft, reducible small umbilical hernia, surgical scar vertically in lower abdomen midline, non tender, non distended, no masses, no hepatomegaly, no splenomegaly, no bruits Back: non tender, normal ROM, no  scoliosis Musculoskeletal: upper extremities non tender, no obvious deformity, normal ROM throughout, lower extremities non tender, no obvious deformity, normal ROM throughout Extremities: no edema, no cyanosis, no clubbing Pulses: 2+ symmetric, upper and lower extremities, normal cap refill Neurological: alert, oriented x 3, CN2-12 intact, strength normal upper extremities and lower extremities, sensation normal throughout, DTRs 2+ throughout, no cerebellar signs, gait normal Psychiatric: normal affect, behavior normal, pleasant  GU: normal male external genitalia,circumcised, nontender, no masses, no hernia, no lymphadenopathy Rectal: Anus normal tone, prostate within normal limits, unable to palpate entire prostate given body habitus   Assessment and Plan :   Encounter Diagnoses  Name Primary?  . Routine general medical examination at a health care facility Yes  . Vaccine counseling   . OSA (obstructive sleep apnea)   . Essential hypertension   . Encounter for health maintenance examination in adult   . Cancer of ascending colon s/p lap assisted right colectomy 08/28/14   . Family history of colon cancer   . History of colon cancer, stage III   . Pure hypercholesterolemia   . BMI 34.0-34.9,adult   . Screening for prostate cancer   .  Abnormal hemoglobin (HCC)   . Leukopenia, unspecified type   . Screening for diabetes mellitus     Today you had a preventative care visit or wellness visit.    Topics today may have included healthy lifestyle, diet, exercise, preventative care, vaccinations, sick and well care, proper use of emergency dept and after hours care, as well as other concerns.     Recommendations: Continue to return yearly for your annual wellness and preventative care visits.  This gives [place] a chance to discuss healthy lifestyle, exercise, vaccinations, review your chart record, and perform screenings where appropriate.  I recommend you see your eye doctor yearly for  routine vision care.  I recommend you see your dentist yearly for routine dental care including hygiene visits twice yearly.   Vaccination recommendations were reviewed You are up to date on Shingrix vaccine You are up to date on Tetanus vaccine You are up to date on Covid vaccine    Screening for cancer: Colon cancer screening, history of colon cancer:  I reviewed your colonoscopy on file that is up to date from 2021  We discussed PSA, prostate exam, and prostate cancer screening risks/benefits.     Skin cancer screening: Check your skin regularly for new changes, growing lesions, or other lesions of concern Come in for evaluation if you have skin lesions of concern.  Lung cancer screening: If you have a greater than 30 pack year history of tobacco use, then you qualify for lung cancer screening with a chest CT scan  We currently don't have screenings for other cancers besides breast, cervical, colon, and lung cancers.  If you have a strong family history of cancer or have other cancer screening concerns, please let me know.    Bone health: Get at least 150 minutes of aerobic exercise weekly Get weight bearing exercise at least once weekly   Heart health: Get at least 150 minutes of aerobic exercise weekly Limit alcohol It is important to maintain a healthy blood pressure and healthy cholesterol numbers   Separate significant issues discussed: Hypertension-not at goal.  Continue current medication,  refer to cardiology / hypertension clinic.  His sleep apnea is untreated at the moment due to some logistical issues with CPAP supplies.  I congratulated him on his weight loss compared to last year.  Hyperlipidemia-labs today, continue statin  History of colon cancer-continue regular follow-up with gastroenterology, oncology  History of abnormal hemoglobin and leukopenia on labs-we will check some additional labs today.  Consider hematology consult  OSA-we will check with  home health about CPAP supplies   Edward Patterson was seen today for annual exam.  Diagnoses and all orders for this visit:  Routine general medical examination at a health care facility -     Comprehensive metabolic panel -     CBC with Differential/Platelet -     Cancel: Hemoglobinopathy Evaluation -     Iron -     Lipid panel -     HIV Antibody (routine testing w rflx) -     Hemoglobin A1c -     PSA -     Hgb Fractionation Cascade  Vaccine counseling  OSA (obstructive sleep apnea)  Essential hypertension  Encounter for health maintenance examination in adult  Cancer of ascending colon s/p lap assisted right colectomy 08/28/14  Family history of colon cancer  History of colon cancer, stage III  Pure hypercholesterolemia -     Lipid panel  BMI 34.0-34.9,adult -     Hemoglobin A1c  Screening for prostate cancer -     PSA  Abnormal hemoglobin (HCC) -     CBC with Differential/Platelet -     Cancel: Hemoglobinopathy Evaluation -     Iron -     Hgb Fractionation Cascade  Leukopenia, unspecified type -     CBC with Differential/Platelet -     Cancel: Hemoglobinopathy Evaluation -     Iron -     Hgb Fractionation Cascade  Screening for diabetes mellitus -     Hemoglobin A1c     Follow-up pending labs, yearly for physical

## 2020-08-03 ENCOUNTER — Other Ambulatory Visit: Payer: Self-pay | Admitting: Medical

## 2020-08-03 LAB — CBC WITH DIFFERENTIAL/PLATELET
Basophils Absolute: 0 10*3/uL (ref 0.0–0.2)
Basos: 1 %
EOS (ABSOLUTE): 0.1 10*3/uL (ref 0.0–0.4)
Eos: 3 %
Hematocrit: 50.6 % (ref 37.5–51.0)
Hemoglobin: 17 g/dL (ref 13.0–17.7)
Immature Grans (Abs): 0 10*3/uL (ref 0.0–0.1)
Immature Granulocytes: 0 %
Lymphocytes Absolute: 1.6 10*3/uL (ref 0.7–3.1)
Lymphs: 45 %
MCH: 29.3 pg (ref 26.6–33.0)
MCHC: 33.6 g/dL (ref 31.5–35.7)
MCV: 87 fL (ref 79–97)
Monocytes Absolute: 0.4 10*3/uL (ref 0.1–0.9)
Monocytes: 11 %
Neutrophils Absolute: 1.5 10*3/uL (ref 1.4–7.0)
Neutrophils: 40 %
Platelets: 183 10*3/uL (ref 150–450)
RBC: 5.81 x10E6/uL — ABNORMAL HIGH (ref 4.14–5.80)
RDW: 12.9 % (ref 11.6–15.4)
WBC: 3.6 10*3/uL (ref 3.4–10.8)

## 2020-08-03 LAB — COMPREHENSIVE METABOLIC PANEL
ALT: 33 IU/L (ref 0–44)
AST: 32 IU/L (ref 0–40)
Albumin/Globulin Ratio: 1.7 (ref 1.2–2.2)
Albumin: 4.3 g/dL (ref 3.8–4.9)
Alkaline Phosphatase: 75 IU/L (ref 44–121)
BUN/Creatinine Ratio: 12 (ref 9–20)
BUN: 12 mg/dL (ref 6–24)
Bilirubin Total: 1.2 mg/dL (ref 0.0–1.2)
CO2: 23 mmol/L (ref 20–29)
Calcium: 9.1 mg/dL (ref 8.7–10.2)
Chloride: 98 mmol/L (ref 96–106)
Creatinine, Ser: 0.99 mg/dL (ref 0.76–1.27)
Globulin, Total: 2.5 g/dL (ref 1.5–4.5)
Glucose: 106 mg/dL — ABNORMAL HIGH (ref 65–99)
Potassium: 4.1 mmol/L (ref 3.5–5.2)
Sodium: 136 mmol/L (ref 134–144)
Total Protein: 6.8 g/dL (ref 6.0–8.5)
eGFR: 89 mL/min/{1.73_m2} (ref >59)

## 2020-08-03 LAB — LIPID PANEL
Chol/HDL Ratio: 2.7 ratio (ref 0.0–5.0)
Cholesterol, Total: 120 mg/dL (ref 100–199)
HDL: 45 mg/dL (ref >39)
LDL Chol Calc (NIH): 62 mg/dL (ref 0–99)
Triglycerides: 58 mg/dL (ref 0–149)
VLDL Cholesterol Cal: 13 mg/dL (ref 5–40)

## 2020-08-03 LAB — PSA: Prostate Specific Ag, Serum: 1.4 ng/mL (ref 0.0–4.0)

## 2020-08-03 LAB — HIV ANTIBODY (ROUTINE TESTING W REFLEX): HIV Screen 4th Generation wRfx: NONREACTIVE

## 2020-08-03 LAB — HEMOGLOBIN A1C
Est. average glucose Bld gHb Est-mCnc: 123 mg/dL
Hgb A1c MFr Bld: 5.9 % — ABNORMAL HIGH (ref 4.8–5.6)

## 2020-08-03 LAB — IRON: Iron: 107 ug/dL (ref 38–169)

## 2020-08-03 MED ORDER — ASPIRIN EC 81 MG PO TBEC: 81.0000 mg | DELAYED_RELEASE_TABLET | Freq: Every day | ORAL | 3 refills | Status: DC

## 2020-08-03 MED ORDER — PRAVASTATIN SODIUM 20 MG PO TABS: 20.0000 mg | ORAL_TABLET | Freq: Every day | ORAL | 3 refills | Status: DC

## 2020-08-03 MED ORDER — DOXAZOSIN MESYLATE 2 MG PO TABS: 2.0000 mg | ORAL_TABLET | Freq: Every day | ORAL | 1 refills | Status: DC

## 2020-08-03 MED ORDER — IRBESARTAN-HYDROCHLOROTHIAZIDE 300-12.5 MG PO TABS: 1.0000 | ORAL_TABLET | Freq: Every day | ORAL | 3 refills | Status: DC

## 2020-08-03 MED ORDER — CARVEDILOL 12.5 MG PO TABS: 12.5000 mg | ORAL_TABLET | Freq: Two times a day (BID) | ORAL | 0 refills | Status: DC

## 2020-08-05 LAB — HGB FRACTIONATION CASCADE
Hgb A2: 2.8 % (ref 1.8–3.2)
Hgb A: 97.2 % (ref 96.4–98.8)
Hgb F: 0 % (ref 0.0–2.0)
Hgb S: 0 %

## 2020-08-20 ENCOUNTER — Other Ambulatory Visit: Payer: Self-pay | Admitting: Medical

## 2020-09-22 ENCOUNTER — Ambulatory Visit: Payer: BC Managed Care – PPO | Admitting: Cardiovascular Disease

## 2020-09-22 NOTE — Progress Notes (Incomplete)
Advanced Hypertension Clinic Initial Assessment:    Date:  09/22/2020   ID:  Edward Patterson, DOB 1966/01/26, MRN [id number]  PCP:  Carlena Hurl, PA-C  Cardiologist:  None  Nephrologist:  Referring MD: Carlena Hurl, PA-C   CC: Hypertension  History of Present Illness:    Edward Patterson is a 55 y.o. male with a hx of hypertension, hyperlipidemia, obesity, obstructive sleep apnea on CPAP, colon cancer (stage TIII, N0),*** here to establish care in the hypertension clinic.   Today, he  He denies any chest pain, shortness of breath, or palpitations. No pre-syncope, syncope, or lightheadedness/dizziness to note. Also has no lower extremity edema, orthopnea or PND.  Previous antihypertensives:  Secondary Causes of Hypertension  Medications/Herbal: OCP, steroids, stimulants, antidepressants, weight loss medication, immune suppressants, NSAIDs, sympathomimetics, alcohol, caffeine, licorice, ginseng, St. John's wort, chemo  Sleep Apnea Renal artery stenosis Hyperaldosteronism Hyper/hypothyroidism Pheochromocytoma: palpitations, tachycardia, headache, diaphoresis (plasma metanephrines) Cushing's syndrome: Cushingoid facies, central obesity, proximal muscle weakness, and ecchymoses, adrenal incidentaloma (cortisol) Coarctation of the aorta  Past Medical History:  Diagnosis Date   Allergy    sinus   Colon cancer ([place]) 08/2014   Stage TIII, N0, invasive adenocarcinoma of hepatic flexure/right colon s/p resection but declined medical oncology f/u   Family history of colon cancer    mother, sister   Hyperlipidemia    Hypertension    Obesity    Obstructive sleep apnea on CPAP    CPAP   Sleep apnea     Past Surgical History:  Procedure Laterality Date   APPENDECTOMY  08/2014   along with right colectomy   COLONOSCOPY  10/2016   polpys, repeat in 3 years.;  Dr. Wilfrid Lund   COLONOSCOPY WITH PROPOFOL N/A 07/13/2014   Procedure: COLONOSCOPY WITH PROPOFOL;   Surgeon: Inda Castle, MD;  Location: [facility];  Service: Endoscopy;  Laterality: N/A;   HOT HEMOSTASIS N/A 07/13/2014   Procedure: HOT HEMOSTASIS (ARGON PLASMA COAGULATION/BICAP);  Surgeon: Inda Castle, MD;  Location: [facility];  Service: Endoscopy;  Laterality: N/A;   LAPAROSCOPIC PARTIAL COLECTOMY N/A 08/28/2014   Procedure: LAPAROSCOPIC ASSISTED right COLECTOMY, ;  Surgeon: Fanny Skates, MD;  Location: [facility];  Service: General;  Laterality: N/A;   WISDOM TOOTH EXTRACTION      Current Medications: No outpatient medications have been marked as taking for the 09/22/20 encounter (Appointment) with Skeet Latch, MD.     Allergies:   Patient has no known allergies.   Social History   Socioeconomic History   Marital status: Married    Spouse name: Not on file   Number of children: Not on file   Years of education: Not on file   Highest education level: Not on file  Occupational History   Not on file  Tobacco Use   Smoking status: Never Smoker   Smokeless tobacco: Never Used  Vaping Use   Vaping Use: Never used  Substance and Sexual Activity   Alcohol use: No   Drug use: No   Sexual activity: Not on file  Other Topics Concern   Not on file  Social History Narrative   Married, 2 children, exercise -  physically active on the job.   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 There is no history of Heart disease, Stroke, Hyperlipidemia, Esophageal cancer, Rectal  cancer, or Stomach cancer.  ROS:   Please see the history of present illness.    (+) All other systems reviewed and are negative.  EKGs/Labs/Other Studies Reviewed:    EKG:   09/22/2020: ***  Recent Labs: 08/02/2020: ALT 33; BUN 12; Creatinine, Ser 0.99; Hemoglobin 17.0; Platelets 183; Potassium 4.1; Sodium 136   Recent Lipid Panel    Component Value Date/Time   CHOL 120 08/02/2020 1029   TRIG 58 08/02/2020 1029   HDL 45 08/02/2020 1029   CHOLHDL 2.7 08/02/2020 1029   CHOLHDL 3.0 08/02/2016 1048   VLDL 22 08/02/2016 1048   LDLCALC 62 08/02/2020 1029    Physical Exam:   VS:  There were no vitals taken for this visit. , BMI There is no height or weight on file to calculate BMI. GENERAL:  Well appearing HEENT: Pupils equal round and reactive, fundi not visualized, oral mucosa unremarkable NECK:  No jugular venous distention, waveform within normal limits, carotid upstroke brisk and symmetric, no bruits, no thyromegaly LYMPHATICS:  No cervical adenopathy LUNGS:  Clear to auscultation bilaterally HEART:  RRR.  PMI not displaced or sustained,S1 and S2 within normal limits, no S3, no S4, no clicks, no rubs, no murmurs ABD:  Flat, positive bowel sounds normal in frequency in pitch, no bruits, no rebound, no guarding, no midline pulsatile mass, no hepatomegaly, no splenomegaly EXT:  2 plus pulses throughout, no edema, no cyanosis no clubbing SKIN:  No rashes no nodules NEURO:  Cranial nerves II through XII grossly intact, motor grossly intact throughout PSYCH:  Cognitively intact, oriented to person place and time   ASSESSMENT/PLAN:    No problem-specific Assessment & Plan notes found for this encounter.   Screening for Secondary Hypertension: { Click here to document screening for secondary causes of HTN  :[id number]}    Relevant Labs/Studies: Basic Labs Latest Ref Rng & Units 08/02/2020 07/30/2019 01/25/2018  Sodium 134 - 144 mmol/L 136 141 134  Potassium 3.5 - 5.2 mmol/L 4.1  4.5 4.0  Creatinine 0.76 - 1.27 mg/dL 0.99 0.97 1.05    Thyroid  Latest Ref Rng & Units 08/02/2016 02/04/2015  TSH 0.40 - 4.50 mIU/L 1.15 1.500                    he consents to be monitored in our remote patient monitoring program through [place].  he will track his blood pressure twice daily and understands that these trends will help [place] to adjust his medications as needed prior to his next appointment.  he *** interested in enrolling in the PREP exercise and nutrition program through the [facility].     Disposition:    FU with MD/PharmD in ***    Medication Adjustments/Labs and Tests Ordered: Current medicines are reviewed at length with the patient today.  Concerns regarding medicines are outlined above.  No orders of the defined types were placed in this encounter.  No orders of the defined types were placed in this encounter.   I,Mathew Stumpf,acting as a Education administrator for Skeet Latch, MD.,have documented all relevant documentation on the behalf of Skeet Latch, MD,as directed by  Skeet Latch, MD while in  the presence of Skeet Latch, MD.  ***  Signed, Madelin Rear  09/22/2020 7:42 AM    [place]

## 2020-10-06 ENCOUNTER — Other Ambulatory Visit: Payer: Self-pay | Admitting: Medical

## 2020-11-01 ENCOUNTER — Other Ambulatory Visit (HOSPITAL_COMMUNITY): Payer: Self-pay

## 2020-12-03 ENCOUNTER — Other Ambulatory Visit: Payer: Self-pay | Admitting: Medical

## 2020-12-21 ENCOUNTER — Encounter: Payer: Self-pay | Admitting: Gastroenterology

## 2020-12-24 ENCOUNTER — Other Ambulatory Visit: Payer: Self-pay | Admitting: Medical

## 2021-01-04 ENCOUNTER — Encounter (HOSPITAL_BASED_OUTPATIENT_CLINIC_OR_DEPARTMENT_OTHER): Payer: Self-pay

## 2021-01-04 ENCOUNTER — Ambulatory Visit (HOSPITAL_BASED_OUTPATIENT_CLINIC_OR_DEPARTMENT_OTHER): Payer: BC Managed Care – PPO | Admitting: Cardiovascular Disease

## 2021-01-21 ENCOUNTER — Encounter: Payer: Self-pay | Admitting: Gastroenterology

## 2021-02-25 ENCOUNTER — Other Ambulatory Visit: Payer: Self-pay | Admitting: Medical

## 2021-03-18 ENCOUNTER — Other Ambulatory Visit: Payer: Self-pay

## 2021-03-18 ENCOUNTER — Ambulatory Visit (AMBULATORY_SURGERY_CENTER): Payer: BC Managed Care – PPO | Admitting: *Deleted

## 2021-03-18 ENCOUNTER — Encounter: Payer: Self-pay | Admitting: Gastroenterology

## 2021-03-18 VITALS — Ht 71.0 in | Wt 244.0 lb

## 2021-03-18 DIAGNOSIS — Z86010 Personal history of colonic polyps: Secondary | ICD-10-CM

## 2021-03-18 DIAGNOSIS — Z85038 Personal history of other malignant neoplasm of large intestine: Secondary | ICD-10-CM

## 2021-03-18 NOTE — Progress Notes (Signed)
Patient's pre-visit was done today over the phone with the patient due to COVID-19 pandemic. Name,DOB and address verified. Patient denies any allergies to Eggs and Soy. Patient denies any problems with anesthesia/sedation. Patient is not taking any diet pills or blood thinners. No home Oxygen. Patient understands to call us back with any questions or concerns. Patient is aware of our care-partner policy and XGXIV-12 safety protocol. Patient was in the 3 rd floor waiting room for phone PV. He was given a copy of prep instructions by Colletta [place]. Patient denies any changes in medical chart hx since last GI visit 2021.  EMMI education assigned to the patient for the procedure, sent to [place].   The patient is COVID-19 vaccinated.

## 2021-03-28 ENCOUNTER — Other Ambulatory Visit: Payer: Self-pay | Admitting: Medical

## 2021-04-01 ENCOUNTER — Other Ambulatory Visit: Payer: Self-pay

## 2021-04-01 ENCOUNTER — Encounter: Payer: Self-pay | Admitting: Gastroenterology

## 2021-04-01 ENCOUNTER — Ambulatory Visit (AMBULATORY_SURGERY_CENTER): Payer: BC Managed Care – PPO | Admitting: Gastroenterology

## 2021-04-01 VITALS — BP 120/75 | HR 69 | Temp 98.4°F | Resp 15 | Ht 71.0 in | Wt 244.0 lb

## 2021-04-01 DIAGNOSIS — Z86010 Personal history of colonic polyps: Secondary | ICD-10-CM | POA: Diagnosis present

## 2021-04-01 MED ORDER — SODIUM CHLORIDE 0.9 % IV SOLN: 500.0000 mL | Freq: Once | INTRAVENOUS | Status: DC

## 2021-04-01 NOTE — Patient Instructions (Signed)
Handout on diverticulosis given.   YOU HAD AN ENDOSCOPIC PROCEDURE TODAY AT THE [facility]:   Refer to the procedure report that was given to you for any specific questions about what was found during the examination.  If the procedure report does not answer your questions, please call your gastroenterologist to clarify.  If you requested that your care partner not be given the details of your procedure findings, then the procedure report has been included in a sealed envelope for you to review at your convenience later.  YOU SHOULD EXPECT: Some feelings of bloating in the abdomen. Passage of more gas than usual.  Walking can help get rid of the air that was put into your GI tract during the procedure and reduce the bloating. If you had a lower endoscopy (such as a colonoscopy or flexible sigmoidoscopy) you may notice spotting of blood in your stool or on the toilet paper. If you underwent a bowel prep for your procedure, you may not have a normal bowel movement for a few days.  Please Note:  You might notice some irritation and congestion in your nose or some drainage.  This is from the oxygen used during your procedure.  There is no need for concern and it should clear up in a day or so.  SYMPTOMS TO REPORT IMMEDIATELY:   Following lower endoscopy (colonoscopy or flexible sigmoidoscopy):  Excessive amounts of blood in the stool  Significant tenderness or worsening of abdominal pains  Swelling of the abdomen that is new, acute  Fever of 100F or higher   For urgent or emergent issues, a gastroenterologist can be reached at any hour by calling [phone]. Do not use MyChart messaging for urgent concerns.    DIET:  We do recommend a small meal at first, but then you may proceed to your regular diet.  Drink plenty of fluids but you should avoid alcoholic beverages for 24 hours.  ACTIVITY:  You should plan to take it easy for the rest of today and you should NOT DRIVE or use  heavy machinery until tomorrow (because of the sedation medicines used during the test).    FOLLOW UP: Our staff will call the number listed on your records 48-72 hours following your procedure to check on you and address any questions or concerns that you may have regarding the information given to you following your procedure. If we do not reach you, we will leave a message.  We will attempt to reach you two times.  During this call, we will ask if you have developed any symptoms of COVID 19. If you develop any symptoms (ie: fever, flu-like symptoms, shortness of breath, cough etc.) before then, please call [phone].  If you test positive for Covid 19 in the 2 weeks post procedure, please call and report this information to us.    If any biopsies were taken you will be contacted by phone or by letter within the next 1-3 weeks.  Please call us at [phone] if you have not heard about the biopsies in 3 weeks.    SIGNATURES/CONFIDENTIALITY: You and/or your care partner have signed paperwork which will be entered into your electronic medical record.  These signatures attest to the fact that that the information above on your After Visit Summary has been reviewed and is understood.  Full responsibility of the confidentiality of this discharge information lies with you and/or your care-partner. 

## 2021-04-01 NOTE — Progress Notes (Signed)
Report given to PACU, vss 

## 2021-04-01 NOTE — Progress Notes (Signed)
0825 BP 176/122, Labetalol given IV, MD update, vss

## 2021-04-01 NOTE — Op Note (Signed)
Edward Patterson Patient Name: Edward Patterson Procedure Date: 04/01/2021 8:28 AM MRN: [id number] Endoscopist: Mallie Mussel L. Loletha Carrow , MD Age: 55 Referring MD:  Date of Birth: 1965/10/30 Gender: Male Account #: [id number] Procedure:                Colonoscopy Indications:              Surveillance: Piecemeal removal of large sessile                            adenoma last colonoscopy (< 3 yrs)                           16-105mm TA and add'l diminutive TA 11/2019                           Diminutive TA and HP 10/2016                           Right hemicolectomy 2016 for T3N0 adenocarcinoma Medicines:                Monitored Anesthesia Care Procedure:                Pre-Anesthesia Assessment:                           - Prior to the procedure, a History and Physical                            was performed, and patient medications and                            allergies were reviewed. The patient's tolerance of                            previous anesthesia was also reviewed. The risks                            and benefits of the procedure and the sedation                            options and risks were discussed with the patient.                            All questions were answered, and informed consent                            was obtained. Prior Anticoagulants: The patient has                            taken no previous anticoagulant or antiplatelet                            agents. ASA Grade Assessment: II - A patient with  mild systemic disease. After reviewing the risks                            and benefits, the patient was deemed in                            satisfactory condition to undergo the procedure.                           After obtaining informed consent, the colonoscope                            was passed under direct vision. Throughout the                            procedure, the patient's blood pressure, pulse, and                             oxygen saturations were monitored continuously. The                            Olympus CF-HQ190L ([id number]) Colonoscope was                            introduced through the anus and advanced to the the                            ileocolonic anastomosis. The colonoscopy was                            performed without difficulty. The patient tolerated                            the procedure well. The quality of the bowel                            preparation was good after lavage. The ileocolonic                            anastomosis and rectum were photographed. The bowel                            preparation used was Miralax. Scope In: 8:33:54 AM Scope Out: 8:50:58 AM Scope Withdrawal Time: 0 hours 13 minutes 19 seconds  Total Procedure Duration: 0 hours 17 minutes 4 seconds  Findings:                 The perianal and digital rectal examinations were                            normal.                           There was evidence of a prior side-to-side  ileo-colonic anastomosis in the proximal transverse                            colon. This was characterized by healthy appearing                            mucosa.                           Diverticula were found from sigmoid to transverse                            colon.                           A tattoo was seen in the transverse colon. The                            tattoo site appeared normal.                           Internal hemorrhoids were found. The hemorrhoids                            were Grade I (internal hemorrhoids that do not                            prolapse).                           The exam was otherwise without abnormality on                            direct and retroflexion views. Complications:            No immediate complications. Estimated Blood Loss:     Estimated blood loss: none. Impression:               - Side-to-side ileo-colonic anastomosis,                             characterized by healthy appearing mucosa.                           - Diverticulosis from sigmoid to transverse colon.                           - A tattoo was seen in the transverse colon. The                            tattoo site appeared normal.                           - Internal hemorrhoids.                           - The examination was otherwise normal on direct  and retroflexion views.                           - No specimens collected. Recommendation:           - Patient has a contact number available for                            emergencies. The signs and symptoms of potential                            delayed complications were discussed with the                            patient. Return to normal activities tomorrow.                            Written discharge instructions were provided to the                            patient.                           - Resume previous diet.                           - Continue present medications.                           - Repeat colonoscopy in 3 years for surveillance                            (Suprep or Plenvu for next exam). Edward Patterson L. Loletha Carrow, MD 04/01/2021 8:57:36 AM This report has been signed electronically.

## 2021-04-01 NOTE — Progress Notes (Signed)
History and Physical:  This patient presents for endoscopic testing for: Encounter Diagnosis  Name Primary?   Personal history of colonic polyps Yes   16-6mm TA and add'l diminutive TA July 2021 Patient denies chronic abdominal pain, rectal bleeding, constipation or diarrhea.   ROS: Patient denies chest pain or cough   Past Medical History: Past Medical History:  Diagnosis Date   Allergy    sinus   Colon cancer ([place]) 08/2014   Stage TIII, N0, invasive adenocarcinoma of hepatic flexure/right colon s/p resection but declined medical oncology f/u   Family history of colon cancer    mother, sister   Hyperlipidemia    Hypertension    Obesity    Obstructive sleep apnea on CPAP    CPAP   Sleep apnea      Past Surgical History: Past Surgical History:  Procedure Laterality Date   APPENDECTOMY  08/2014   along with right colectomy   COLONOSCOPY  10/2016   polpys, repeat in 3 years.;  Dr. Wilfrid Lund   COLONOSCOPY WITH PROPOFOL N/A 07/13/2014   Procedure: COLONOSCOPY WITH PROPOFOL;  Surgeon: Inda Castle, MD;  Location: [facility];  Service: Endoscopy;  Laterality: N/A;   HOT HEMOSTASIS N/A 07/13/2014   Procedure: HOT HEMOSTASIS (ARGON PLASMA COAGULATION/BICAP);  Surgeon: Inda Castle, MD;  Location: [facility];  Service: Endoscopy;  Laterality: N/A;   LAPAROSCOPIC PARTIAL COLECTOMY N/A 08/28/2014   Procedure: LAPAROSCOPIC ASSISTED right COLECTOMY, ;  Surgeon: Fanny Skates, MD;  Location: [facility];  Service: General;  Laterality: N/A;   WISDOM TOOTH EXTRACTION      Allergies: No Known Allergies  Outpatient Meds: Current Outpatient Medications  Medication Sig Dispense Refill   aspirin EC 81 MG tablet Take 1 tablet (81 mg total) by mouth daily. 90 tablet 3   carvedilol (COREG) 12.5 MG tablet TAKE 1 TABLET BY MOUTH 2 TIMES DAILY WITH A MEAL. 180 tablet 0   doxazosin (CARDURA) 2 MG tablet TAKE 1 TABLET BY MOUTH EVERY DAY 30 tablet 2   irbesartan-hydrochlorothiazide  (AVALIDE) 300-12.5 MG tablet Take 1 tablet by mouth daily. 90 tablet 3   Multiple Vitamin (MULTIVITAMIN) capsule Take 1 capsule by mouth every morning.      Omega-3 Fatty Acids (FISH OIL PO) Take 1 capsule by mouth daily at 3 pm.      pravastatin (PRAVACHOL) 20 MG tablet Take 1 tablet (20 mg total) by mouth daily. 90 tablet 3   Current Facility-Administered Medications  Medication Dose Route Frequency Provider Last Rate Last Admin   0.9 %  sodium chloride infusion  500 mL Intravenous Once Doran Stabler, MD          ___________________________________________________________________ Objective   Exam:  BP (!) 161/105   Pulse 80   Temp 98.4 F (36.9 C)   Ht 5\' 11"  (1.803 m)   Wt 244 lb (110.7 kg)   SpO2 98%   BMI 34.03 kg/m   CV: RRR without murmur, S1/S2 Resp: clear to auscultation bilaterally, normal RR and effort noted GI: soft, no tenderness, with active bowel sounds.   Assessment: Encounter Diagnosis  Name Primary?   Personal history of colonic polyps Yes     Plan: Colonoscopy  The benefits and risks of the planned procedure were described in detail with the patient or (when appropriate) their health care proxy.  Risks were outlined as including, but not limited to, bleeding, infection, perforation, adverse medication reaction leading to cardiac or pulmonary decompensation, pancreatitis (if ERCP).  The limitation of incomplete mucosal visualization was also discussed.  No guarantees or warranties were given.    The patient is appropriate for an endoscopic procedure in the ambulatory setting.   - Wilfrid Lund, MD

## 2021-04-01 NOTE — Progress Notes (Signed)
Pt's states no medical or surgical changes since previsit or office visit.   VS taken by [place]

## 2021-04-05 ENCOUNTER — Telehealth: Payer: Self-pay

## 2021-04-05 NOTE — Telephone Encounter (Signed)
  Follow up Call-  Call back number 04/01/2021 12/02/2019  Post procedure Call Back phone  # [phone]  Permission to leave phone message Yes Yes  Some recent data might be hidden     Patient questions:  Do you have a fever, pain , or abdominal swelling? No. Pain Score  0 *  Have you tolerated food without any problems? Yes.    Have you been able to return to your normal activities? Yes.    Do you have any questions about your discharge instructions: Diet   No. Medications  No. Follow up visit  No.  Do you have questions or concerns about your Care? No.  Actions: * If pain score is 4 or above: No action needed, pain <4.

## 2021-06-01 ENCOUNTER — Encounter: Payer: Self-pay | Admitting: Internal Medicine

## 2021-06-04 ENCOUNTER — Other Ambulatory Visit: Payer: Self-pay | Admitting: Medical

## 2021-06-06 NOTE — Telephone Encounter (Signed)
Has an appt in march

## 2021-06-08 NOTE — Telephone Encounter (Signed)
Got flu shot in September at [facility]

## 2021-08-03 ENCOUNTER — Encounter: Payer: BC Managed Care – PPO | Admitting: Medical

## 2021-08-05 ENCOUNTER — Encounter: Payer: Self-pay | Admitting: Medical

## 2021-08-18 ENCOUNTER — Other Ambulatory Visit: Payer: Self-pay | Admitting: Medical

## 2021-09-07 ENCOUNTER — Other Ambulatory Visit: Payer: Self-pay | Admitting: Medical

## 2021-09-07 NOTE — Telephone Encounter (Signed)
Has appt coming up in june ?

## 2021-09-23 ENCOUNTER — Other Ambulatory Visit: Payer: Self-pay | Admitting: Medical

## 2021-10-18 ENCOUNTER — Encounter: Payer: Self-pay | Admitting: Medical

## 2021-10-18 ENCOUNTER — Ambulatory Visit (INDEPENDENT_AMBULATORY_CARE_PROVIDER_SITE_OTHER): Payer: BC Managed Care – PPO | Admitting: Medical

## 2021-10-18 ENCOUNTER — Other Ambulatory Visit: Payer: Self-pay | Admitting: *Deleted

## 2021-10-18 VITALS — BP 130/80 | HR 105 | Ht 70.0 in | Wt 253.6 lb

## 2021-10-18 DIAGNOSIS — Z0000 Encounter for general adult medical examination without abnormal findings: Secondary | ICD-10-CM

## 2021-10-18 DIAGNOSIS — I10 Essential (primary) hypertension: Secondary | ICD-10-CM

## 2021-10-18 DIAGNOSIS — Z7185 Encounter for immunization safety counseling: Secondary | ICD-10-CM | POA: Diagnosis not present

## 2021-10-18 DIAGNOSIS — Z125 Encounter for screening for malignant neoplasm of prostate: Secondary | ICD-10-CM | POA: Diagnosis not present

## 2021-10-18 DIAGNOSIS — Z6836 Body mass index (BMI) 36.0-36.9, adult: Secondary | ICD-10-CM | POA: Insufficient documentation

## 2021-10-18 DIAGNOSIS — G4733 Obstructive sleep apnea (adult) (pediatric): Secondary | ICD-10-CM

## 2021-10-18 DIAGNOSIS — R0683 Snoring: Secondary | ICD-10-CM

## 2021-10-18 DIAGNOSIS — Z131 Encounter for screening for diabetes mellitus: Secondary | ICD-10-CM | POA: Diagnosis not present

## 2021-10-18 DIAGNOSIS — E7800 Pure hypercholesterolemia, unspecified: Secondary | ICD-10-CM

## 2021-10-18 LAB — POCT URINALYSIS DIP (PROADVANTAGE DEVICE)
Bilirubin, UA: NEGATIVE
Blood, UA: NEGATIVE
Glucose, UA: NEGATIVE mg/dL
Ketones, POC UA: NEGATIVE mg/dL
Leukocytes, UA: NEGATIVE
Nitrite, UA: NEGATIVE
Protein Ur, POC: NEGATIVE mg/dL
Specific Gravity, Urine: 1.01
Urobilinogen, Ur: NEGATIVE
pH, UA: 6 (ref 5.0–8.0)

## 2021-10-18 NOTE — Progress Notes (Signed)
Subjective:   HPI  Edward Patterson is a 56 y.o. male who presents for Chief Complaint  Patient presents with   Annual Exam    Fasting (blood in lab) annual exam. No concerns.     Patient Care Team: Chaos Carlile, Leward Quan as PCP - General (Family Medicine) Sees dentist Sees eye doctor Dr. Wilfrid Lund, gastroenterology Genetic counseling, Ferol Luz Dr. Donneta Romberg, oncology Dr. Fanny Skates, general surgery   Concerns: HTN -compliant with medication.  Not checking home blood pressures.  He attributes his high blood pressure to working 2 full-time jobs.    OSA-not currently using CPAP.   Needs updated testing and supplies.  Hyperlipidemia-compliant with medication without complaint  Otherwise normal state of health   Reviewed their medical, surgical, family, social, medication, and allergy history and updated chart as appropriate.  Past Medical History:  Diagnosis Date   Allergy    sinus   Colon cancer ([place]) 08/2014   Stage TIII, N0, invasive adenocarcinoma of hepatic flexure/right colon s/p resection but declined medical oncology f/u   Family history of colon cancer    mother, sister   Hyperlipidemia    Hypertension    Obesity    Obstructive sleep apnea on CPAP    CPAP   Sleep apnea     Past Surgical History:  Procedure Laterality Date   APPENDECTOMY  08/2014   along with right colectomy   COLONOSCOPY  10/2016   polpys, repeat in 3 years.;  Dr. Wilfrid Lund   COLONOSCOPY WITH PROPOFOL N/A 07/13/2014   Procedure: COLONOSCOPY WITH PROPOFOL;  Surgeon: Inda Castle, MD;  Location: [facility];  Service: Endoscopy;  Laterality: N/A;   HOT HEMOSTASIS N/A 07/13/2014   Procedure: HOT HEMOSTASIS (ARGON PLASMA COAGULATION/BICAP);  Surgeon: Inda Castle, MD;  Location: [facility];  Service: Endoscopy;  Laterality: N/A;   LAPAROSCOPIC PARTIAL COLECTOMY N/A 08/28/2014   Procedure: LAPAROSCOPIC ASSISTED right COLECTOMY, ;  Surgeon: Fanny Skates, MD;  Location:  [facility];  Service: General;  Laterality: N/A;   WISDOM TOOTH EXTRACTION      Family History  Problem Relation Age of Onset   Hypertension Mother    Colon cancer Mother 31       died at 62   Colon cancer Sister 40   Diabetes Father    Edema Sister    Diabetes Maternal Aunt    Cancer Maternal Uncle        type unk- died in his 36's, hx of smoking   Heart disease Neg Hx    Stroke Neg Hx    Hyperlipidemia Neg Hx    Esophageal cancer Neg Hx    Rectal cancer Neg Hx    Stomach cancer Neg Hx      Current Outpatient Medications:    aspirin EC 81 MG tablet, Take 1 tablet (81 mg total) by mouth daily., Disp: 90 tablet, Rfl: 3   carvedilol (COREG) 12.5 MG tablet, TAKE 1 TABLET BY MOUTH 2 TIMES DAILY WITH A MEAL., Disp: 180 tablet, Rfl: 0   doxazosin (CARDURA) 2 MG tablet, TAKE 1 TABLET BY MOUTH EVERY DAY, Disp: 30 tablet, Rfl: 0   irbesartan-hydrochlorothiazide (AVALIDE) 300-12.5 MG tablet, TAKE 1 TABLET BY MOUTH EVERY DAY, Disp: 90 tablet, Rfl: 0   Multiple Vitamin (MULTIVITAMIN) capsule, Take 1 capsule by mouth every morning. , Disp: , Rfl:    Omega-3 Fatty Acids (FISH OIL PO), Take 1 capsule by mouth daily at 3 pm. , Disp: , Rfl:  pravastatin (PRAVACHOL) 20 MG tablet, TAKE 1 TABLET BY MOUTH EVERY DAY, Disp: 90 tablet, Rfl: 0  No Known Allergies     Review of Systems Constitutional: -fever, -chills, -sweats, -unexpected weight change, -decreased appetite, -fatigue Allergy: -sneezing, -itching, -congestion Dermatology: -changing moles, --rash, -lumps ENT: -runny nose, -ear pain, -sore throat, -hoarseness, -sinus pain, -teeth pain, - ringing in ears, -hearing loss, -nosebleeds Cardiology: -chest pain, -palpitations, -swelling, -difficulty breathing when lying flat, -waking up short of breath Respiratory: -cough, -shortness of breath, -difficulty breathing with exercise or exertion, -wheezing, -coughing up blood Gastroenterology: -abdominal pain, -nausea, -vomiting, -diarrhea,  -constipation, -blood in stool, -changes in bowel movement, -difficulty swallowing or eating Hematology: -bleeding, -bruising  Musculoskeletal: -joint aches, -muscle aches, -joint swelling, -back pain, -neck pain, -cramping, -changes in gait Ophthalmology: denies vision changes, eye redness, itching, discharge Urology: -burning with urination, -difficulty urinating, -blood in urine, -urinary frequency, -urgency, -incontinence Neurology: -headache, -weakness, -tingling, -numbness, -memory loss, -falls, -dizziness Psychology: -depressed mood, -agitation, -sleep problems Male GU: no testicular mass, pain, no lymph nodes swollen, no swelling, no rash.     Objective:  BP 130/80   Pulse (!) 105   Ht '5\' 10"'$  (1.778 m)   Wt 253 lb 9.6 oz (115 kg)   BMI 36.39 kg/m   Wt Readings from Last 3 Encounters:  10/18/21 253 lb 9.6 oz (115 kg)  04/01/21 244 lb (110.7 kg)  03/18/21 244 lb (110.7 kg)   BP Readings from Last 3 Encounters:  10/18/21 130/80  04/01/21 120/75  08/02/20 (!) 174/108    General appearance: alert, no distress, WD/WN, African American male Skin: Scattered skin tags along left and right lateral neck as well as posterior neck, small sebaceous cyst left upper back less than half centimeter diameter HEENT: normocephalic, conjunctiva/corneas normal, sclerae anicteric, PERRLA, EOMi, nares patent, no discharge or erythema, pharynx normal Oral cavity: MMM, tongue normal, teeth normal Neck: supple, no lymphadenopathy, no thyromegaly, no masses, normal ROM, no bruits Chest: non tender, normal shape and expansion Heart: RRR, normal S1, S2, no murmurs Lungs: CTA bilaterally, no wheezes, rhonchi, or rales Abdomen: +bs, soft, reducible small umbilical hernia, surgical scar vertically in lower abdomen midline, non tender, non distended, no masses, no hepatomegaly, no splenomegaly, no bruits Back: non tender, normal ROM, no scoliosis Musculoskeletal: upper extremities non tender, no obvious  deformity, normal ROM throughout, lower extremities non tender, no obvious deformity, normal ROM throughout Extremities: no edema, no cyanosis, no clubbing Pulses: 2+ symmetric, upper and lower extremities, normal cap refill Neurological: alert, oriented x 3, CN2-12 intact, strength normal upper extremities and lower extremities, sensation normal throughout, DTRs 2+ throughout, no cerebellar signs, gait normal Psychiatric: normal affect, behavior normal, pleasant  GU: normal male external genitalia,circumcised, nontender, no masses, no hernia, no lymphadenopathy Rectal: Anus normal tone, prostate within normal limits, unable to palpate entire prostate given body habitus   Assessment and Plan :   Encounter Diagnoses  Name Primary?   Annual physical exam Yes   Routine general medical examination at a health care facility    Vaccine counseling    Screening for prostate cancer    Screening for diabetes mellitus    BMI 36.0-36.9,adult    Essential hypertension    Pure hypercholesterolemia     Today you had a preventative care visit or wellness visit.    Topics today may have included healthy lifestyle, diet, exercise, preventative care, vaccinations, sick and well care, proper use of emergency dept and after hours care, as well as  other concerns.     Recommendations: Continue to return yearly for your annual wellness and preventative care visits.  This gives [place] a chance to discuss healthy lifestyle, exercise, vaccinations, review your chart record, and perform screenings where appropriate.  I recommend you see your eye doctor yearly for routine vision care.  I recommend you see your dentist yearly for routine dental care including hygiene visits twice yearly.   Vaccination recommendations were reviewed Immunization History  Administered Date(s) Administered   Influenza,inj,Quad PF,6+ Mos 01/26/2014, 02/04/2015, 03/04/2019   Influenza-Unspecified 02/21/2017, 01/13/2021   PFIZER  Comirnaty(Gray Top)Covid-19 Tri-Sucrose Vaccine 05/29/2020   Tdap 02/04/2015   Zoster Recombinat (Shingrix) 08/18/2019, 11/15/2019    Screening for cancer: Colon cancer screening, history of colon cancer:  I reviewed your colonoscopy on file that is up to date from 11/22  We discussed PSA, prostate exam, and prostate cancer screening risks/benefits.     Skin cancer screening: Check your skin regularly for new changes, growing lesions, or other lesions of concern Come in for evaluation if you have skin lesions of concern.  Lung cancer screening: If you have a greater than 30 pack year history of tobacco use, then you qualify for lung cancer screening with a chest CT scan  We currently don't have screenings for other cancers besides breast, cervical, colon, and lung cancers.  If you have a strong family history of cancer or have other cancer screening concerns, please let me know.    Bone health: Get at least 150 minutes of aerobic exercise weekly Get weight bearing exercise at least once weekly   Heart health: Get at least 150 minutes of aerobic exercise weekly Limit alcohol It is important to maintain a healthy blood pressure and healthy cholesterol numbers  Consider CT coronary score test   Separate significant issues discussed: Hypertension- Continue current medication]  Hyperlipidemia-labs today, continue statin  History of colon cancer-continue regular follow-up with gastroenterology, oncology  OSA-referral back for updated sleep testing.   Edward Patterson was seen today for annual exam.  Diagnoses and all orders for this visit:  Annual physical exam -     POCT Urinalysis DIP (Proadvantage Device)  Routine general medical examination at a health care facility -     Comprehensive metabolic panel -     CBC with Differential/Platelet -     Lipid panel -     Hemoglobin A1c -     PSA -     Cancel: Urinalysis, Routine w reflex microscopic  Vaccine  counseling  Screening for prostate cancer -     PSA  Screening for diabetes mellitus -     Hemoglobin A1c  BMI 36.0-36.9,adult  Essential hypertension  Pure hypercholesterolemia -     Lipid panel    Follow-up pending labs, yearly for physical

## 2021-10-19 ENCOUNTER — Other Ambulatory Visit: Payer: Self-pay | Admitting: Medical

## 2021-10-19 LAB — HEMOGLOBIN A1C
Est. average glucose Bld gHb Est-mCnc: 120 mg/dL
Hgb A1c MFr Bld: 5.8 % — ABNORMAL HIGH (ref 4.8–5.6)

## 2021-10-19 LAB — COMPREHENSIVE METABOLIC PANEL
ALT: 27 IU/L (ref 0–44)
AST: 26 IU/L (ref 0–40)
Albumin/Globulin Ratio: 1.6 (ref 1.2–2.2)
Albumin: 4.2 g/dL (ref 3.8–4.9)
Alkaline Phosphatase: 71 IU/L (ref 44–121)
BUN/Creatinine Ratio: 17 (ref 9–20)
BUN: 16 mg/dL (ref 6–24)
Bilirubin Total: 0.8 mg/dL (ref 0.0–1.2)
CO2: 24 mmol/L (ref 20–29)
Calcium: 9 mg/dL (ref 8.7–10.2)
Chloride: 101 mmol/L (ref 96–106)
Creatinine, Ser: 0.95 mg/dL (ref 0.76–1.27)
Globulin, Total: 2.6 g/dL (ref 1.5–4.5)
Glucose: 99 mg/dL (ref 70–99)
Potassium: 4.3 mmol/L (ref 3.5–5.2)
Sodium: 138 mmol/L (ref 134–144)
Total Protein: 6.8 g/dL (ref 6.0–8.5)
eGFR: 93 mL/min/{1.73_m2} (ref >59)

## 2021-10-19 LAB — CBC WITH DIFFERENTIAL/PLATELET
Basophils Absolute: 0 10*3/uL (ref 0.0–0.2)
Basos: 1 %
EOS (ABSOLUTE): 0.2 10*3/uL (ref 0.0–0.4)
Eos: 4 %
Hematocrit: 50.4 % (ref 37.5–51.0)
Hemoglobin: 16.9 g/dL (ref 13.0–17.7)
Immature Grans (Abs): 0 10*3/uL (ref 0.0–0.1)
Immature Granulocytes: 0 %
Lymphocytes Absolute: 1.9 10*3/uL (ref 0.7–3.1)
Lymphs: 50 %
MCH: 29.4 pg (ref 26.6–33.0)
MCHC: 33.5 g/dL (ref 31.5–35.7)
MCV: 88 fL (ref 79–97)
Monocytes Absolute: 0.4 10*3/uL (ref 0.1–0.9)
Monocytes: 11 %
Neutrophils Absolute: 1.3 10*3/uL — ABNORMAL LOW (ref 1.4–7.0)
Neutrophils: 34 %
Platelets: 168 10*3/uL (ref 150–450)
RBC: 5.74 x10E6/uL (ref 4.14–5.80)
RDW: 13 % (ref 11.6–15.4)
WBC: 3.9 10*3/uL (ref 3.4–10.8)

## 2021-10-19 LAB — LIPID PANEL
Chol/HDL Ratio: 2.9 ratio (ref 0.0–5.0)
Cholesterol, Total: 143 mg/dL (ref 100–199)
HDL: 49 mg/dL (ref >39)
LDL Chol Calc (NIH): 73 mg/dL (ref 0–99)
Triglycerides: 118 mg/dL (ref 0–149)
VLDL Cholesterol Cal: 21 mg/dL (ref 5–40)

## 2021-10-19 LAB — PSA: Prostate Specific Ag, Serum: 1.4 ng/mL (ref 0.0–4.0)

## 2021-10-19 MED ORDER — DOXAZOSIN MESYLATE 2 MG PO TABS: 2.0000 mg | ORAL_TABLET | Freq: Every day | ORAL | 3 refills | Status: DC

## 2021-11-13 ENCOUNTER — Other Ambulatory Visit: Payer: Self-pay | Admitting: Medical

## 2021-11-16 ENCOUNTER — Ambulatory Visit (HOSPITAL_BASED_OUTPATIENT_CLINIC_OR_DEPARTMENT_OTHER): Payer: BC Managed Care – PPO | Admitting: Internal Medicine

## 2021-11-16 DIAGNOSIS — R0683 Snoring: Secondary | ICD-10-CM

## 2021-11-16 DIAGNOSIS — Z6836 Body mass index (BMI) 36.0-36.9, adult: Secondary | ICD-10-CM

## 2021-11-16 DIAGNOSIS — G4733 Obstructive sleep apnea (adult) (pediatric): Secondary | ICD-10-CM

## 2021-11-17 ENCOUNTER — Ambulatory Visit (HOSPITAL_BASED_OUTPATIENT_CLINIC_OR_DEPARTMENT_OTHER): Payer: BC Managed Care – PPO | Admitting: Internal Medicine

## 2021-11-17 VITALS — Ht 71.0 in | Wt 253.0 lb

## 2021-11-21 ENCOUNTER — Encounter (HOSPITAL_BASED_OUTPATIENT_CLINIC_OR_DEPARTMENT_OTHER): Payer: BC Managed Care – PPO | Admitting: Internal Medicine

## 2021-11-22 ENCOUNTER — Other Ambulatory Visit: Payer: Self-pay

## 2021-11-22 ENCOUNTER — Telehealth: Payer: Self-pay

## 2021-11-22 DIAGNOSIS — R0683 Snoring: Secondary | ICD-10-CM

## 2021-11-22 DIAGNOSIS — G4733 Obstructive sleep apnea (adult) (pediatric): Secondary | ICD-10-CM

## 2021-11-22 NOTE — Telephone Encounter (Signed)
The sleep center called and

## 2021-11-22 NOTE — Telephone Encounter (Signed)
Sleep disorder center called and said pt failed his home sleep test twice and does not want a 3rd one. They wanted to see if you would be ok with an in lab study. They will need a new order and they will also check to see if his insurance will cover it.

## 2021-11-23 NOTE — Telephone Encounter (Signed)
Pt called and states that his insurance will approve in lab sleep study if PA is done. Pt didn't know he needed to call sleep center to give them the [employer] for [place]. I called and left a message on sleep center vm for them to call [employer] at [phone]. Please follow-up with them to make sure they received message and working on this

## 2021-11-23 NOTE — Telephone Encounter (Signed)
Spoke to sleep center and No prior Edward Patterson is needed. Pt would need to call [employer] to find out if anything is owed out of pocket for testing.

## 2021-11-27 ENCOUNTER — Ambulatory Visit (HOSPITAL_BASED_OUTPATIENT_CLINIC_OR_DEPARTMENT_OTHER): Payer: BC Managed Care – PPO | Attending: Medical | Admitting: Internal Medicine

## 2021-11-27 VITALS — Ht 71.0 in | Wt 253.0 lb

## 2021-11-27 DIAGNOSIS — R0683 Snoring: Secondary | ICD-10-CM | POA: Diagnosis present

## 2021-11-27 DIAGNOSIS — G4733 Obstructive sleep apnea (adult) (pediatric): Secondary | ICD-10-CM

## 2021-12-02 ENCOUNTER — Other Ambulatory Visit: Payer: Self-pay | Admitting: Medical

## 2021-12-04 DIAGNOSIS — R0683 Snoring: Secondary | ICD-10-CM

## 2021-12-04 NOTE — Procedures (Signed)
Patient Name: Edward Patterson, Edward Patterson Date: 11/27/2021 Gender: Male D.O.B: [birth year] Age (years): 55 Referring Provider: Crosby Oyster Height (inches): 70 Interpreting Physician: Jetty Duhamel MD, ABSM Weight (lbs): 253 RPSGT: [place]ug[place], [place] BMI: 36 MRN: [id number] Neck Size: 17.75  CLINICAL INFORMATION Sleep Study Type: Split Night CPAP Indication for sleep study: Excessive Daytime Sleepiness, OSA, Snoring Epworth Sleepiness Score: 22  SLEEP STUDY TECHNIQUE As per the AASM Manual for the Scoring of Sleep and Associated Events v2.3 (April 2016) with a hypopnea requiring 4% desaturations.  The channels recorded and monitored were frontal, central and occipital EEG, electrooculogram (EOG), submentalis EMG (chin), nasal and oral airflow, thoracic and abdominal wall motion, anterior tibialis EMG, snore microphone, electrocardiogram, and pulse oximetry. Continuous positive airway pressure (CPAP) was initiated when the patient met split night criteria and was titrated according to treat sleep-disordered breathing.  MEDICATIONS Medications self-administered by patient taken the night of the study : none reported  RESPIRATORY PARAMETERS Diagnostic  Total AHI (/hr): 5.5 RDI (/hr): 5.5 OA Index (/hr): 0.8 CA Index (/hr): 5.5 REM AHI (/hr): 5.5 NREM AHI (/hr): 5.5 Supine AHI (/hr): 5.5 Non-supine AHI (/hr): 5.6 Min O2 Sat (%): 100.00 Mean O2 (%): 100.00 Time below 88% (min): 5.5   Titration  Optimal Pressure (cm): 15 AHI at Optimal Pressure (/hr): N/A Min O2 at Optimal Pressure (%): 100.00 Supine % at Optimal (%): N/A Sleep % at Optimal (%): N/A   SLEEP ARCHITECTURE The recording time for the entire night was 480 minutes.  During a baseline period of 480.0 minutes, the patient slept for 380.0 minutes in REM and nonREM, yielding a sleep efficiency of 222.2%. Sleep onset after lights out was 20.0 minutes with a REM latency of 5.5 minutes. The patient spent 5.55% of the night in  stage N1 sleep, 5.55% in stage N2 sleep, 5.55% in stage N3 and 5.6% in REM.  During the titration period of 480.0 minutes, the patient slept for 380.0 minutes in REM and nonREM, yielding a sleep efficiency of 222.2%. Sleep onset after CPAP initiation was 20.0 minutes with a REM latency of 5.5 minutes. The patient spent 5.55% of the night in stage N1 sleep, 5.55% in stage N2 sleep, 5.55% in stage N3 and 5.6% in REM.  CARDIAC DATA The 2 lead EKG demonstrated sinus rhythm. The mean heart rate was 100.00 beats per minute. Other EKG findings include: None.  LEG MOVEMENT DATA The total Periodic Limb Movements of Sleep (PLMS) were 5. The PLMS index was 5.55 .  IMPRESSIONS - Mild obstructive sleep apnea occurred during the diagnostic portion of the study (AHI = 5.5 /hour). An optimal PAP pressure was selected for this patient ( 15 cm of water) - Mild central sleep apnea occurred during the diagnostic portion of the study (CAI = 5.5/hour). - The patient had minimal or no oxygen desaturation during the diagnostic portion of the study (Min O2 = 78%), Mean 92.9% - The patient snored with moderate snoring volume during the diagnostic portion of the study. - No cardiac abnormalities were noted during this study. - No periodic limb movements of sleep occurred during the study.  DIAGNOSIS - Obstructive Sleep Apnea (G47.33)  RECOMMENDATIONS - Trial of CPAP therapy on 15 cm H2O or autopap 10-20. - Patient used a Large size Philips Respironics Nasal Pillow Nuance Pro Gel mask and heated humidification. - Treatment of very mild OSA is directed by symptoms and co-morbidity. Conservative measures including observation, weight loss and sleep position off back may be sufficient.  Other options,including CPAP, would be based on clinical judgment. - Be careful with alcohol, sedatives and other CNS depressants that may worsen sleep apnea and disrupt normal sleep architecture. - Sleep hygiene should be reviewed to  assess factors that may improve sleep quality. - Weight management and regular exercise should be initiated or continued. - Return to Sleep Center for re-evaluation after 4 weeks of therapy  [Electronically signed] 12/04/2021 03:20 PM  Baird Lyons MD, [place], American Board of Sleep Medicine NPI: [id number]                         [place]  ELECTRONICALLY SIGNED ON:  12/04/2021, 3:15 PM [place] PH: [phone]   FX: [phone] [place]

## 2021-12-05 ENCOUNTER — Other Ambulatory Visit: Payer: Self-pay

## 2021-12-05 ENCOUNTER — Telehealth: Payer: Self-pay

## 2021-12-05 DIAGNOSIS — R0683 Snoring: Secondary | ICD-10-CM

## 2021-12-05 DIAGNOSIS — G4733 Obstructive sleep apnea (adult) (pediatric): Secondary | ICD-10-CM

## 2021-12-05 NOTE — Telephone Encounter (Signed)
Pt. Called wanting to know if you got his Sleep study back yet. He wanted to get a cpap ordered if needed and possible.

## 2021-12-05 NOTE — Telephone Encounter (Signed)
Ordered cpap through [employer]

## 2021-12-11 ENCOUNTER — Other Ambulatory Visit: Payer: Self-pay | Admitting: Medical

## 2021-12-27 ENCOUNTER — Encounter (HOSPITAL_BASED_OUTPATIENT_CLINIC_OR_DEPARTMENT_OTHER): Payer: BC Managed Care – PPO | Admitting: Internal Medicine

## 2021-12-30 ENCOUNTER — Encounter (HOSPITAL_BASED_OUTPATIENT_CLINIC_OR_DEPARTMENT_OTHER): Payer: BC Managed Care – PPO | Admitting: Internal Medicine

## 2022-01-18 ENCOUNTER — Encounter: Payer: Self-pay | Admitting: Internal Medicine

## 2022-03-10 ENCOUNTER — Other Ambulatory Visit: Payer: Self-pay | Admitting: Medical

## 2022-03-30 ENCOUNTER — Other Ambulatory Visit: Payer: Self-pay | Admitting: Medical

## 2022-04-10 ENCOUNTER — Encounter: Payer: Self-pay | Admitting: Podiatry

## 2022-04-10 ENCOUNTER — Ambulatory Visit (INDEPENDENT_AMBULATORY_CARE_PROVIDER_SITE_OTHER): Payer: BC Managed Care – PPO

## 2022-04-10 ENCOUNTER — Ambulatory Visit: Payer: BC Managed Care – PPO | Admitting: Podiatry

## 2022-04-10 DIAGNOSIS — R600 Localized edema: Secondary | ICD-10-CM

## 2022-04-10 DIAGNOSIS — M779 Enthesopathy, unspecified: Secondary | ICD-10-CM

## 2022-04-10 MED ORDER — DICLOFENAC SODIUM 75 MG PO TBEC: 75.0000 mg | DELAYED_RELEASE_TABLET | Freq: Two times a day (BID) | ORAL | 2 refills | Status: DC

## 2022-04-11 NOTE — Progress Notes (Signed)
Subjective:   Patient ID: Edward Patterson, male   DOB: 56 y.o.   MRN: [id number]   HPI Patient presents with right ankle and foot edema and states that he has been occurring over the last couple months but his knee is what is bothering him the most and has not had that checked at this point.  Patient does not smoke likes to be active   Review of Systems  All other systems reviewed and are negative.       Objective:  Physical Exam Vitals and nursing note reviewed.  Constitutional:      Appearance: He is well-developed.  Pulmonary:     Effort: Pulmonary effort is normal.  Musculoskeletal:        General: Normal range of motion.  Skin:    General: Skin is warm.  Neurological:     Mental Status: He is alert.     Neurovascular status intact muscle strength found to be adequate range of motion adequate with patient noted to have moderate edema of the right midfoot and ankle with +1 pitting right negative Bevelyn Buckles' sign noted and what appears to be meniscus pain in the right knee.  Good digital perfusion well-oriented     Assessment:  Appears to be a low to mid grade swelling pattern cannot rule out arthritis or other bone pathology and at this point no indications of blood clot     Plan:  H&P x-rays reviewed applied compression stocking advised on elevation and if symptoms were to get worse or any other issues were to occur he is to go to the emergency room.  I do want him to see an orthopedic surgeon to have his knee evaluated and he will be seen by me as symptoms indicate  X-rays indicate that there is no signs of fracture or arthritis associated with the swelling condition

## 2022-04-14 ENCOUNTER — Other Ambulatory Visit: Payer: Self-pay | Admitting: Medical

## 2022-06-10 ENCOUNTER — Other Ambulatory Visit: Payer: Self-pay | Admitting: Medical

## 2022-06-22 ENCOUNTER — Other Ambulatory Visit: Payer: Self-pay | Admitting: Podiatry

## 2022-06-22 ENCOUNTER — Other Ambulatory Visit: Payer: Self-pay | Admitting: Medical

## 2022-07-06 ENCOUNTER — Other Ambulatory Visit: Payer: Self-pay | Admitting: Medical

## 2022-07-10 ENCOUNTER — Encounter: Payer: BC Managed Care – PPO | Admitting: Medical

## 2022-07-21 ENCOUNTER — Other Ambulatory Visit: Payer: Self-pay | Admitting: Medical

## 2022-08-04 ENCOUNTER — Other Ambulatory Visit: Payer: Self-pay | Admitting: Medical

## 2022-08-31 ENCOUNTER — Ambulatory Visit (INDEPENDENT_AMBULATORY_CARE_PROVIDER_SITE_OTHER): Payer: BC Managed Care – PPO | Admitting: Medical

## 2022-08-31 ENCOUNTER — Encounter: Payer: Self-pay | Admitting: Medical

## 2022-08-31 VITALS — BP 136/84 | HR 83 | Wt 232.6 lb

## 2022-08-31 DIAGNOSIS — Z131 Encounter for screening for diabetes mellitus: Secondary | ICD-10-CM | POA: Diagnosis not present

## 2022-08-31 DIAGNOSIS — M25561 Pain in right knee: Secondary | ICD-10-CM

## 2022-08-31 DIAGNOSIS — E669 Obesity, unspecified: Secondary | ICD-10-CM

## 2022-08-31 DIAGNOSIS — E7800 Pure hypercholesterolemia, unspecified: Secondary | ICD-10-CM

## 2022-08-31 DIAGNOSIS — I10 Essential (primary) hypertension: Secondary | ICD-10-CM

## 2022-08-31 DIAGNOSIS — G8929 Other chronic pain: Secondary | ICD-10-CM

## 2022-08-31 NOTE — Progress Notes (Signed)
Subjective:   HPI  Edward Patterson is a 57 y.o. male who presents for Chief Complaint  Patient presents with   Medication Management    Needs refills    Patient Care Team: Carizma Dunsworth, Cleda Mccreedy as PCP - General (Family Medicine) Sees dentist Sees eye doctor Dr. Amada Jupiter, gastroenterology Genetic counseling, Darral Dash Dr. Dan Humphreys, oncology Dr. Claud Kelp, general surgery   Concerns: Here for med check.  Compliant with medicaiton for BP and cholesterol.  Would still like to come off some medications.  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  Has lost weight finally.  Feels good.     Does go gym some when possible.   Being careful to avoid salt.     Still having some right knee pains.   No swelling.  No injury.  Worried about arthritis.   Using OTC topical cream, knee sleeve, OTC oral joint remedy.  Reviewed their medical, surgical, family, social, medication, and allergy history and updated chart as appropriate.  No Known Allergies  Past Medical History:  Diagnosis Date   Allergy    sinus   Colon cancer 08/2014   Stage TIII, N0, invasive adenocarcinoma of hepatic flexure/right colon s/p resection but declined medical oncology f/u   Family history of colon cancer    mother, sister   Hyperlipidemia    Hypertension    Obesity    Obstructive sleep apnea on CPAP    CPAP   Sleep apnea      Current Outpatient Medications:    aspirin EC 81 MG tablet, Take 1 tablet (81 mg total) by mouth daily., Disp: 90 tablet, Rfl: 3   carvedilol (COREG) 12.5 MG tablet, TAKE 1 TABLET BY MOUTH TWICE A DAY WITH FOOD, Disp: 60 tablet, Rfl: 1   diclofenac (VOLTAREN) 75 MG EC tablet, Take 1 tablet (75 mg total) by mouth 2 (two) times daily., Disp: 50 tablet, Rfl: 2   doxazosin (CARDURA) 2 MG tablet, Take 1 tablet (2 mg total) by mouth daily., Disp: 90 tablet, Rfl: 3   irbesartan-hydrochlorothiazide (AVALIDE) 300-12.5 MG tablet, TAKE 1 TABLET BY  MOUTH EVERY DAY, Disp: 30 tablet, Rfl: 0   Multiple Vitamin (MULTIVITAMIN) capsule, Take 1 capsule by mouth every morning. , Disp: , Rfl:    Omega-3 Fatty Acids (FISH OIL PO), Take 1 capsule by mouth daily at 3 pm. , Disp: , Rfl:    pravastatin (PRAVACHOL) 20 MG tablet, TAKE 1 TABLET BY MOUTH EVERY DAY, Disp: 90 tablet, Rfl: 1  Family History  Problem Relation Age of Onset   Hypertension Mother    Colon cancer Mother 45       died at 32   Colon cancer Sister 11   Diabetes Father    Edema Sister    Diabetes Maternal Aunt    Cancer Maternal Uncle        type unk- died in his 87's, hx of smoking   Heart disease Neg Hx    Stroke Neg Hx    Hyperlipidemia Neg Hx    Esophageal cancer Neg Hx    Rectal cancer Neg Hx    Stomach cancer Neg Hx     ROS as in subjective     Objective:  BP 136/84 (BP Location: Left Arm, Patient Position: Sitting, Cuff Size: Large)   Pulse 83   Wt 232 lb 9.6 oz (105.5 kg)   SpO2 99%   BMI 32.44 kg/m   BP Readings from Last  3 Encounters:  08/31/22 136/84  10/18/21 130/80  04/01/21 120/75   Wt Readings from Last 3 Encounters:  08/31/22 232 lb 9.6 oz (105.5 kg)  11/27/21 253 lb (114.8 kg)  11/17/21 253 lb (114.8 kg)    General appearance: alert, no distress, WD/WN, African American male Heart: RRR, normal S1, S2, no murmurs Lungs: CTA bilaterally, no wheezes, rhonchi, or rales Extremities: no edema, no cyanosis, no clubbing Pulses: 2+ symmetric, upper and lower extremities, normal cap refill    Assessment and Plan :   Encounter Diagnoses  Name Primary?   Essential hypertension Yes   Pure hypercholesterolemia    Screening for diabetes mellitus    Obesity with serious comorbidity, unspecified classification, unspecified obesity type    Chronic pain of right knee     Hypertension Continue Carvedilol 12.5mg  twice daily Continue Cardura 2mg  daily Continue Irbesartan HCT 300/12.5mg  daily   Sleep apnea  Continue CPAP   Hyperlipidemia   Continue Pravachol 20mg  daily, aspirin 81mg  daily, fish oil daily Routine labs today   Obesity  Continue efforts with healthy eating and regular exercise to lose more weight.  Congratulation on losing some weight since last visit.   Screen for heart disease  We discussed CT coronary screening.   He may want to pursue this for screening and to possibly help come off some medicaiton.   Right knee pain - likely some OA.  Will have him go for updated xray.   Continue topical remedies, relative rest, knee sleeve.   Impaired glucose - improved since recent weight loss   Helge was seen today for medication management.  Diagnoses and all orders for this visit:  Essential hypertension  Pure hypercholesterolemia  Screening for diabetes mellitus  Obesity with serious comorbidity, unspecified classification, unspecified obesity type  Chronic pain of right knee -     DG Knee Complete 4 Views Right; Future   Follow-up pending labs, yearly for physical

## 2022-09-01 ENCOUNTER — Other Ambulatory Visit: Payer: Self-pay | Admitting: Medical

## 2022-09-22 ENCOUNTER — Other Ambulatory Visit: Payer: Self-pay | Admitting: Medical

## 2022-09-22 ENCOUNTER — Telehealth: Payer: Self-pay | Admitting: Medical

## 2022-09-22 NOTE — Telephone Encounter (Signed)
Nace called and states he was supposed to be set up for imaging and was wondering if it was set up yet?

## 2022-09-22 NOTE — Telephone Encounter (Signed)
Pt was notified to go get xray that was placed back on 4/18

## 2022-09-26 ENCOUNTER — Ambulatory Visit
Admission: RE | Admit: 2022-09-26 | Discharge: 2022-09-26 | Disposition: A | Discharge status: Home / Self Care | Payer: BC Managed Care – PPO | Source: Ambulatory Visit | Attending: Medical | Admitting: Medical

## 2022-09-26 DIAGNOSIS — G8929 Other chronic pain: Secondary | ICD-10-CM

## 2022-09-27 ENCOUNTER — Telehealth: Payer: Self-pay | Admitting: Medical

## 2022-09-27 NOTE — Telephone Encounter (Signed)
Edward Patterson called and wanted you to know he went and got his x ray done yesterday and he was interested in the results. I told him [name] would call once you were able to look over them.

## 2022-09-28 NOTE — Telephone Encounter (Signed)
Pt called again this morning, he is asking if you know how long it will take for him to get his results? I did tell him the radiologist had to finalize first but he is persistent about wanting to know the results.

## 2022-09-29 ENCOUNTER — Telehealth: Payer: Self-pay | Admitting: Medical

## 2022-09-29 NOTE — Telephone Encounter (Signed)
Pt called and states he got his xray results today. He is requesting something to help with the pain other than otc. Pt uses [facility] [address] Pt can be reached at [phone]. Pt advised Vincenza Hews already left for the day.

## 2022-09-30 ENCOUNTER — Other Ambulatory Visit: Payer: Self-pay | Admitting: Medical

## 2022-09-30 MED ORDER — DICLOFENAC SODIUM 75 MG PO TBEC: 75.0000 mg | DELAYED_RELEASE_TABLET | Freq: Two times a day (BID) | ORAL | 0 refills | Status: DC

## 2022-09-30 NOTE — Progress Notes (Signed)
Results sent through MyChart

## 2022-10-04 ENCOUNTER — Telehealth: Payer: Self-pay | Admitting: Medical

## 2022-10-04 ENCOUNTER — Other Ambulatory Visit: Payer: Self-pay | Admitting: Medical

## 2022-10-04 MED ORDER — ACETAMINOPHEN-CODEINE 300-30 MG PO TABS: 1.0000 | ORAL_TABLET | Freq: Two times a day (BID) | ORAL | 0 refills | Status: DC | PRN

## 2022-10-04 NOTE — Telephone Encounter (Signed)
Pt states the Diclofenac is causing issues with his vision, and also when he drives messes with his vision.  Would like something else called in for pain in his knee, he uses [facility]

## 2022-10-05 ENCOUNTER — Telehealth: Payer: Self-pay | Admitting: Medical

## 2022-10-05 NOTE — Telephone Encounter (Signed)
Edward Patterson     10/05/22 10:59 AM Note Pt called and I advised the last two messages from [place]. He says he does not want to be referred to ortho. He states he has been taking an OTC arthritis medicine that has been helping and he plans to go to the gym to work the knee.

## 2022-10-05 NOTE — Telephone Encounter (Signed)
Pharmracy sent message stating that acetaminophen is out of stock please send alternative  [id number] [place], [place] - [address]

## 2022-10-05 NOTE — Telephone Encounter (Signed)
Pt called and I advised the last two messages from [place]. He says he does not want to be referred to ortho. He states he has been taking an OTC arthritis medicine that has been helping and he plans to go to the gym to work the knee.

## 2022-10-06 ENCOUNTER — Other Ambulatory Visit: Payer: Self-pay | Admitting: Medical

## 2022-10-06 ENCOUNTER — Telehealth: Payer: Self-pay | Admitting: Medical

## 2022-10-06 MED ORDER — HYDROCODONE-ACETAMINOPHEN 5-325 MG PO TABS: 1.0000 | ORAL_TABLET | Freq: Two times a day (BID) | ORAL | 0 refills | Status: DC | PRN

## 2022-10-06 NOTE — Telephone Encounter (Signed)
Left message for pt to call me back 

## 2022-10-06 NOTE — Telephone Encounter (Signed)
Acetaminophen-cod #3 tablet   [facility] requested; out of stock, item on back order, ? Alternative

## 2022-10-06 NOTE — Telephone Encounter (Signed)
Left detailed message on pt's vm about med and referral

## 2022-10-06 NOTE — Telephone Encounter (Signed)
Pt is taking an Arthritis OTC [facility] brand of medication. Does he need to continue this or stop this if he takes tylenlol #3

## 2022-10-16 ENCOUNTER — Other Ambulatory Visit: Payer: Self-pay | Admitting: Medical

## 2022-10-18 ENCOUNTER — Telehealth: Payer: Self-pay | Admitting: Medical

## 2022-10-18 ENCOUNTER — Other Ambulatory Visit: Payer: Self-pay | Admitting: Medical

## 2022-10-18 MED ORDER — HYDROCODONE-ACETAMINOPHEN 5-325 MG PO TABS: 1.0000 | ORAL_TABLET | Freq: Two times a day (BID) | ORAL | 0 refills | Status: DC | PRN

## 2022-10-18 NOTE — Telephone Encounter (Signed)
Pt called requesting refill hydrocodone  He states he is seeing some improvement, he has not taken in a few days only takes when needed

## 2022-10-25 ENCOUNTER — Telehealth: Payer: Self-pay | Admitting: Medical

## 2022-10-25 NOTE — Telephone Encounter (Signed)
Pt called and states he has finished the HYDROcodone-acetaminophen (NORCO) 5-325 MG tablet and is asking for a refill due to pain in knee. He uses  [id number], [place] - [address]

## 2022-10-26 ENCOUNTER — Other Ambulatory Visit: Payer: Self-pay | Admitting: Medical

## 2022-10-26 DIAGNOSIS — G8929 Other chronic pain: Secondary | ICD-10-CM

## 2022-10-26 MED ORDER — HYDROCODONE-ACETAMINOPHEN 5-325 MG PO TABS: 1.0000 | ORAL_TABLET | Freq: Two times a day (BID) | ORAL | 0 refills | Status: DC | PRN

## 2022-10-26 NOTE — Telephone Encounter (Signed)
Left message for pt to call me back 

## 2022-10-27 ENCOUNTER — Telehealth: Payer: Self-pay | Admitting: Medical

## 2022-10-27 NOTE — Telephone Encounter (Signed)
Pt called and says he is supposed to update you on how he is doing, He says the pain in his knee is not consistent it comes and goes. He can still get around. He tried to call and set up an appointment with ortho. I did verify and confirm the referral I seen in the system for him and he says he will try to call again.

## 2022-10-27 NOTE — Telephone Encounter (Signed)
Left detailed message for pt 

## 2022-11-05 ENCOUNTER — Telehealth: Payer: Self-pay | Admitting: Medical

## 2022-11-05 NOTE — Telephone Encounter (Signed)
P.A. HYDROCODONE/ACET 

## 2022-11-11 NOTE — Telephone Encounter (Signed)
P.A. approved til 11/05/23, sent mychart message

## 2022-12-12 ENCOUNTER — Other Ambulatory Visit: Payer: Self-pay | Admitting: Medical

## 2023-01-21 ENCOUNTER — Other Ambulatory Visit: Payer: Self-pay | Admitting: Medical

## 2023-01-30 ENCOUNTER — Other Ambulatory Visit: Payer: Self-pay | Admitting: Medical

## 2023-03-13 ENCOUNTER — Other Ambulatory Visit: Payer: Self-pay | Admitting: Medical

## 2023-04-09 ENCOUNTER — Other Ambulatory Visit: Payer: Self-pay | Admitting: Medical

## 2023-04-09 NOTE — Telephone Encounter (Signed)
Pt has an appt in January

## 2023-04-10 ENCOUNTER — Encounter: Payer: BC Managed Care – PPO | Admitting: Medical

## 2023-05-23 ENCOUNTER — Encounter: Payer: Self-pay | Admitting: Medical

## 2023-05-23 ENCOUNTER — Ambulatory Visit: Payer: 59 | Admitting: Medical

## 2023-05-23 VITALS — BP 130/70 | HR 83 | Ht 70.0 in | Wt 246.0 lb

## 2023-05-23 DIAGNOSIS — Z6835 Body mass index (BMI) 35.0-35.9, adult: Secondary | ICD-10-CM | POA: Insufficient documentation

## 2023-05-23 DIAGNOSIS — Z125 Encounter for screening for malignant neoplasm of prostate: Secondary | ICD-10-CM | POA: Diagnosis not present

## 2023-05-23 DIAGNOSIS — E7800 Pure hypercholesterolemia, unspecified: Secondary | ICD-10-CM

## 2023-05-23 DIAGNOSIS — I10 Essential (primary) hypertension: Secondary | ICD-10-CM

## 2023-05-23 DIAGNOSIS — Z131 Encounter for screening for diabetes mellitus: Secondary | ICD-10-CM | POA: Diagnosis not present

## 2023-05-23 DIAGNOSIS — B356 Tinea cruris: Secondary | ICD-10-CM | POA: Insufficient documentation

## 2023-05-23 DIAGNOSIS — Z0000 Encounter for general adult medical examination without abnormal findings: Secondary | ICD-10-CM

## 2023-05-23 LAB — POCT URINALYSIS DIP (PROADVANTAGE DEVICE)
Bilirubin, UA: NEGATIVE
Blood, UA: NEGATIVE
Glucose, UA: NEGATIVE mg/dL
Ketones, POC UA: NEGATIVE mg/dL
Leukocytes, UA: NEGATIVE
Nitrite, UA: NEGATIVE
Protein Ur, POC: NEGATIVE mg/dL
Specific Gravity, Urine: 1.005
Urobilinogen, Ur: NEGATIVE
pH, UA: 6 (ref 5.0–8.0)

## 2023-05-23 LAB — LIPID PANEL

## 2023-05-23 MED ORDER — ASPIRIN 81 MG PO TBEC: 81.0000 mg | DELAYED_RELEASE_TABLET | Freq: Every day | ORAL | 3 refills | Status: DC

## 2023-05-23 MED ORDER — FLUCONAZOLE 150 MG PO TABS: 150.0000 mg | ORAL_TABLET | ORAL | 0 refills | Status: DC

## 2023-05-23 MED ORDER — TERBINAFINE HCL 1 % EX CREA: 1.0000 | TOPICAL_CREAM | Freq: Two times a day (BID) | CUTANEOUS | 0 refills | Status: DC

## 2023-05-23 MED ORDER — DOXAZOSIN MESYLATE 2 MG PO TABS: 2.0000 mg | ORAL_TABLET | Freq: Every day | ORAL | 2 refills | Status: DC

## 2023-05-23 MED ORDER — IRBESARTAN-HYDROCHLOROTHIAZIDE 300-12.5 MG PO TABS: 1.0000 | ORAL_TABLET | Freq: Every day | ORAL | 2 refills | Status: DC

## 2023-05-23 MED ORDER — CARVEDILOL 12.5 MG PO TABS: 12.5000 mg | ORAL_TABLET | Freq: Two times a day (BID) | ORAL | 2 refills | Status: DC

## 2023-05-23 NOTE — Patient Instructions (Signed)
  Today you had a preventative care visit or wellness visit.    Topics today may have included healthy lifestyle, diet, exercise, preventative care, vaccinations, sick and well care, proper use of emergency dept and after hours care, as well as other concerns.     Recommendations: Continue to return yearly for your annual wellness and preventative care visits.  This gives us  a chance to discuss healthy lifestyle, exercise, vaccinations, review your chart record, and perform screenings where appropriate.  I recommend you see your eye doctor yearly for routine vision care.  I recommend you see your dentist yearly for routine dental care including hygiene visits twice yearly.   Vaccination recommendations were reviewed Immunization History  Administered Date(s) Administered   Influenza, Seasonal, Injecte, Preservative Fre 03/11/2023   Influenza,inj,Quad PF,6+ Mos 01/26/2014, 02/04/2015, 03/04/2019, 02/12/2022   Influenza-Unspecified 02/21/2017, 01/13/2021   PFIZER Comirnaty(Gray Top)Covid-19 Tri-Sucrose Vaccine 05/29/2020   Tdap 02/04/2015   Unspecified SARS-COV-2 Vaccination 02/12/2022   Zoster Recombinant(Shingrix) 08/18/2019, 11/15/2019    Screening for cancer: Colon cancer screening, history of colon cancer:  I reviewed your colonoscopy on file that is up to date from 11/22  We discussed PSA, prostate exam, and prostate cancer screening risks/benefits.     Skin cancer screening: Check your skin regularly for new changes, growing lesions, or other lesions of concern Come in for evaluation if you have skin lesions of concern.  Lung cancer screening: If you have a greater than 30 pack year history of tobacco use, then you qualify for lung cancer screening with a chest CT scan  We currently don't have screenings for other cancers besides breast, cervical, colon, and lung cancers.  If you have a strong family history of cancer or have other cancer screening concerns, please let me  know.    Bone health: Get at least 150 minutes of aerobic exercise weekly Get weight bearing exercise at least once weekly   Heart health: Get at least 150 minutes of aerobic exercise weekly Limit alcohol It is important to maintain a healthy blood pressure and healthy cholesterol numbers  Consider CT coronary score test, heart disease screen, $95 cash pay    Separate significant issues discussed: Obesity-work on her lose weight through healthy diet and exercise.  Consider weight loss medication such as Wegovy   Tinea cruris/jock itch-continue Diflucan  150 mg weekly for 3 weeks and topical Lamisil  cream daily for the next 2 to 3 weeks until this has resolved  Hypertension- Continue current medication carvedilol  12.5 mg twice daily, Cardura  2 mg daily, irbesartan  HCT 300/12.5 mg daily  Hyperlipidemia-labs today, continue statin and aspirin  daily  History of colon cancer-continue regular follow-up with gastroenterology, oncology  OSA-compliant with CPAP, continue CPAP as he gets benefit from this.

## 2023-05-23 NOTE — Progress Notes (Signed)
 Subjective:   HPI  Edward Patterson is a 58 y.o. male who presents for Chief Complaint  Patient presents with   Annual Exam    Fasting cpe, had labs done this morning    Patient Care Team: Treazure Nery, Alm RAMAN, PA-C as PCP - General (Family Medicine) Legrand, Victory LITTIE MOULD, MD as Consulting Physician (Gastroenterology) Cloretta Arley NOVAK, MD as Consulting Physician (Oncology) Gail Favorite, MD as Referring Physician (General Surgery) Currence, Lamar PEDLAR, PA-C (Orthopedic Surgery) Magdalen Pasco RAMAN, DPM as Consulting Physician (Podiatry) Brinda Elsie BRAVO, OD (Optometry) Sees dentist Genetic counseling, Manuelita Sharps   Concerns: HTN -compliant with medications, Coreg  12.5mg  BID, cardura  2mg  daily, irbersartan HCT 300/12.5mg  daily  Hyperlipidemia-compliant with medication without complaint, Pravachol  20mg  and Aspirin  81mg  daily and fish oil OTC  Otherwise normal state of health  Still having onnging knee issues, seeing ortho.  Knees bother him fairly often.    Having some jock itch problems itching, rash   Reviewed their medical, surgical, family, social, medication, and allergy history and updated chart as appropriate.  Past Medical History:  Diagnosis Date   Allergy    sinus   Colon cancer (HCC) 08/2014   Stage TIII, N0, invasive adenocarcinoma of hepatic flexure/right colon s/p resection but declined medical oncology f/u   Family history of colon cancer    mother, sister   Hyperlipidemia    Hypertension    Obesity    Obstructive sleep apnea on CPAP    CPAP   Sleep apnea     Past Surgical History:  Procedure Laterality Date   APPENDECTOMY  08/2014   along with right colectomy   COLONOSCOPY  10/2016   polpys, repeat in 3 years.;  Dr. Victory Legrand   COLONOSCOPY WITH PROPOFOL  N/A 07/13/2014   Procedure: COLONOSCOPY WITH PROPOFOL ;  Surgeon: Lamar JONETTA Aho, MD;  Location: [facility];  Service: Endoscopy;  Laterality: N/A;   HOT HEMOSTASIS N/A 07/13/2014   Procedure: HOT  HEMOSTASIS (ARGON PLASMA COAGULATION/BICAP);  Surgeon: Lamar JONETTA Aho, MD;  Location: [facility];  Service: Endoscopy;  Laterality: N/A;   LAPAROSCOPIC PARTIAL COLECTOMY N/A 08/28/2014   Procedure: LAPAROSCOPIC ASSISTED right COLECTOMY, ;  Surgeon: Favorite Gail, MD;  Location: [facility];  Service: General;  Laterality: N/A;   WISDOM TOOTH EXTRACTION      Family History  Problem Relation Age of Onset   Hypertension Mother    Colon cancer Mother 57       died at 74   Colon cancer Sister 44   Diabetes Father    Edema Sister    Diabetes Maternal Aunt    Cancer Maternal Uncle        type unk- died in his 75's, hx of smoking   Heart disease Neg Hx    Stroke Neg Hx    Hyperlipidemia Neg Hx    Esophageal cancer Neg Hx    Rectal cancer Neg Hx    Stomach cancer Neg Hx      Current Outpatient Medications:    aspirin  EC 81 MG tablet, Take 1 tablet (81 mg total) by mouth daily., Disp: 90 tablet, Rfl: 3   fluconazole  (DIFLUCAN ) 150 MG tablet, Take 1 tablet (150 mg total) by mouth once a week., Disp: 3 tablet, Rfl: 0   Multiple Vitamin (MULTIVITAMIN) capsule, Take 1 capsule by mouth every morning. , Disp: , Rfl:    Omega-3 Fatty Acids (FISH OIL PO), Take 1 capsule by mouth daily at 3 pm. , Disp: , Rfl:  pravastatin  (PRAVACHOL ) 20 MG tablet, TAKE 1 TABLET BY MOUTH EVERY DAY, Disp: 90 tablet, Rfl: 0   terbinafine  (LAMISIL  AT) 1 % cream, Apply 1 Application topically 2 (two) times daily., Disp: 30 g, Rfl: 0   carvedilol  (COREG ) 12.5 MG tablet, Take 1 tablet (12.5 mg total) by mouth 2 (two) times daily with a meal., Disp: 180 tablet, Rfl: 2   doxazosin  (CARDURA ) 2 MG tablet, Take 1 tablet (2 mg total) by mouth daily., Disp: 90 tablet, Rfl: 2   irbesartan -hydrochlorothiazide  (AVALIDE) 300-12.5 MG tablet, Take 1 tablet by mouth daily., Disp: 90 tablet, Rfl: 2  No Known Allergies   Review of Systems  Constitutional:  Negative for chills, fever, malaise/fatigue and weight loss.  HENT:  Negative  for congestion, ear pain, hearing loss, sore throat and tinnitus.   Eyes:  Negative for blurred vision, pain and redness.  Respiratory:  Negative for cough, hemoptysis and shortness of breath.   Cardiovascular:  Negative for chest pain, palpitations, orthopnea, claudication and leg swelling.  Gastrointestinal:  Negative for abdominal pain, blood in stool, constipation, diarrhea, nausea and vomiting.  Genitourinary:  Negative for dysuria, flank pain, frequency, hematuria and urgency.  Musculoskeletal:  Positive for joint pain. Negative for falls and myalgias.  Skin:  Positive for itching and rash.  Neurological:  Negative for dizziness, tingling, speech change, weakness and headaches.  Endo/Heme/Allergies:  Negative for polydipsia. Does not bruise/bleed easily.  Psychiatric/Behavioral:  Negative for depression and memory loss. The patient is not nervous/anxious and does not have insomnia.        Objective:  BP 130/70   Pulse 83   Ht 5' 10 (1.778 m)   Wt 246 lb (111.6 kg)   SpO2 98%   BMI 35.30 kg/m   Wt Readings from Last 3 Encounters:  05/23/23 246 lb (111.6 kg)  08/31/22 232 lb 9.6 oz (105.5 kg)  11/27/21 253 lb (114.8 kg)   BP Readings from Last 3 Encounters:  05/23/23 130/70  08/31/22 136/84  10/18/21 130/80    General appearance: alert, no distress, WD/WN, African American male Skin: Scattered skin tags along left and right lateral neck as well as posterior neck, small sebaceous cyst left upper back less than half centimeter diameter HEENT: normocephalic, conjunctiva/corneas normal, sclerae anicteric, PERRLA, EOMi, nares patent, no discharge or erythema, pharynx normal Oral cavity: MMM, tongue normal, teeth normal Neck: supple, no lymphadenopathy, no thyromegaly, no masses, normal ROM, no bruits Chest: non tender, normal shape and expansion Heart: RRR, normal S1, S2, no murmurs Lungs: CTA bilaterally, no wheezes, rhonchi, or rales Abdomen: +bs, soft, reducible small  umbilical hernia, surgical scar vertically in lower abdomen midline, non tender, non distended, no masses, no hepatomegaly, no splenomegaly, no bruits Back: non tender, normal ROM, no scoliosis Musculoskeletal: upper extremities non tender, no obvious deformity, normal ROM throughout, lower extremities non tender, no obvious deformity, normal ROM throughout Extremities: no edema, no cyanosis, no clubbing Pulses: 2+ symmetric, upper and lower extremities, normal cap refill Neurological: alert, oriented x 3, CN2-12 intact, strength normal upper extremities and lower extremities, sensation normal throughout, DTRs 2+ throughout, no cerebellar signs, gait normal Psychiatric: normal affect, behavior normal, pleasant  GU: contiguous rash left and right inguinal region including scrotum, otherwise normal male external genitalia,circumcised, nontender, no masses, no hernia, no lymphadenopathy Rectal: Anus normal tone, prostate within normal limits, unable to palpate entire prostate given body habitus   Assessment and Plan :   Encounter Diagnoses  Name Primary?   Routine general  medical examination at a health care facility Yes   Pure hypercholesterolemia    Essential hypertension    Screening for prostate cancer    Screening for diabetes mellitus    Tinea cruris    BMI 35.0-35.9,adult     Today you had a preventative care visit or wellness visit.    Topics today may have included healthy lifestyle, diet, exercise, preventative care, vaccinations, sick and well care, proper use of emergency dept and after hours care, as well as other concerns.     Recommendations: Continue to return yearly for your annual wellness and preventative care visits.  This gives us  a chance to discuss healthy lifestyle, exercise, vaccinations, review your chart record, and perform screenings where appropriate.  I recommend you see your eye doctor yearly for routine vision care.  I recommend you see your dentist yearly  for routine dental care including hygiene visits twice yearly.   Vaccination recommendations were reviewed Immunization History  Administered Date(s) Administered   Influenza, Seasonal, Injecte, Preservative Fre 03/11/2023   Influenza,inj,Quad PF,6+ Mos 01/26/2014, 02/04/2015, 03/04/2019, 02/12/2022   Influenza-Unspecified 02/21/2017, 01/13/2021   PFIZER Comirnaty(Gray Top)Covid-19 Tri-Sucrose Vaccine 05/29/2020   Tdap 02/04/2015   Unspecified SARS-COV-2 Vaccination 02/12/2022   Zoster Recombinant(Shingrix) 08/18/2019, 11/15/2019    Screening for cancer: Colon cancer screening, history of colon cancer:  I reviewed your colonoscopy on file that is up to date from 11/22  We discussed PSA, prostate exam, and prostate cancer screening risks/benefits.     Skin cancer screening: Check your skin regularly for new changes, growing lesions, or other lesions of concern Come in for evaluation if you have skin lesions of concern.  Lung cancer screening: If you have a greater than 30 pack year history of tobacco use, then you qualify for lung cancer screening with a chest CT scan  We currently don't have screenings for other cancers besides breast, cervical, colon, and lung cancers.  If you have a strong family history of cancer or have other cancer screening concerns, please let me know.    Bone health: Get at least 150 minutes of aerobic exercise weekly Get weight bearing exercise at least once weekly   Heart health: Get at least 150 minutes of aerobic exercise weekly Limit alcohol It is important to maintain a healthy blood pressure and healthy cholesterol numbers  Consider CT coronary score test, heart disease screen, $95 cash pay    Separate significant issues discussed: Obesity-work on her lose weight through healthy diet and exercise.  Consider weight loss medication such as Wegovy   Tinea cruris/jock itch-continue Diflucan  150 mg weekly for 3 weeks and topical Lamisil  cream  daily for the next 2 to 3 weeks until this has resolved  Hypertension- Continue current medication carvedilol  12.5 mg twice daily, Cardura  2 mg daily, irbesartan  HCT 300/12.5 mg daily  Hyperlipidemia-labs today, continue statin and aspirin  daily  History of colon cancer-continue regular follow-up with gastroenterology, oncology  OSA-compliant with CPAP, continue CPAP as he gets benefit from this.     Latron was seen today for annual exam.  Diagnoses and all orders for this visit:  Routine general medical examination at a health care facility -     Comprehensive metabolic panel -     CBC with Differential/Platelet -     Lipid panel -     Hemoglobin A1c -     PSA -     POCT Urinalysis DIP (Proadvantage Device)  Pure hypercholesterolemia -     Lipid panel  Essential hypertension -     Comprehensive metabolic panel -     POCT Urinalysis DIP (Proadvantage Device)  Screening for prostate cancer -     PSA  Screening for diabetes mellitus -     Hemoglobin A1c  Tinea cruris  BMI 35.0-35.9,adult  Other orders -     fluconazole  (DIFLUCAN ) 150 MG tablet; Take 1 tablet (150 mg total) by mouth once a week. -     terbinafine  (LAMISIL  AT) 1 % cream; Apply 1 Application topically 2 (two) times daily. -     aspirin  EC 81 MG tablet; Take 1 tablet (81 mg total) by mouth daily. -     doxazosin  (CARDURA ) 2 MG tablet; Take 1 tablet (2 mg total) by mouth daily. -     carvedilol  (COREG ) 12.5 MG tablet; Take 1 tablet (12.5 mg total) by mouth 2 (two) times daily with a meal. -     irbesartan -hydrochlorothiazide  (AVALIDE) 300-12.5 MG tablet; Take 1 tablet by mouth daily.    Follow-up pending labs, yearly for physical

## 2023-05-24 ENCOUNTER — Other Ambulatory Visit: Payer: Self-pay | Admitting: Medical

## 2023-05-24 ENCOUNTER — Telehealth: Payer: Self-pay | Admitting: Medical

## 2023-05-24 LAB — COMPREHENSIVE METABOLIC PANEL
ALT: 26 IU/L (ref 0–44)
AST: 27 IU/L (ref 0–40)
Albumin: 4.1 g/dL (ref 3.8–4.9)
Alkaline Phosphatase: 81 IU/L (ref 44–121)
BUN/Creatinine Ratio: 22 — ABNORMAL HIGH (ref 9–20)
BUN: 21 mg/dL (ref 6–24)
Bilirubin Total: 1.1 mg/dL (ref 0.0–1.2)
CO2: 21 mmol/L (ref 20–29)
Calcium: 9.1 mg/dL (ref 8.7–10.2)
Chloride: 102 mmol/L (ref 96–106)
Creatinine, Ser: 0.95 mg/dL (ref 0.76–1.27)
Globulin, Total: 2.6 g/dL (ref 1.5–4.5)
Glucose: 92 mg/dL (ref 70–99)
Potassium: 4.1 mmol/L (ref 3.5–5.2)
Sodium: 138 mmol/L (ref 134–144)
Total Protein: 6.7 g/dL (ref 6.0–8.5)
eGFR: 92 mL/min/{1.73_m2} (ref >59)

## 2023-05-24 LAB — LIPID PANEL
Cholesterol, Total: 142 mg/dL (ref 100–199)
HDL: 51 mg/dL (ref >39)
LDL CALC COMMENT:: 2.8 ratio (ref 0.0–5.0)
LDL Chol Calc (NIH): 77 mg/dL (ref 0–99)
Triglycerides: 69 mg/dL (ref 0–149)
VLDL Cholesterol Cal: 14 mg/dL (ref 5–40)

## 2023-05-24 LAB — CBC WITH DIFFERENTIAL/PLATELET
Basophils Absolute: 0 10*3/uL (ref 0.0–0.2)
Basos: 1 %
EOS (ABSOLUTE): 0.2 10*3/uL (ref 0.0–0.4)
Eos: 4 %
Hematocrit: 50.8 % (ref 37.5–51.0)
Hemoglobin: 16.5 g/dL (ref 13.0–17.7)
Immature Grans (Abs): 0 10*3/uL (ref 0.0–0.1)
Immature Granulocytes: 0 %
Lymphocytes Absolute: 2.3 10*3/uL (ref 0.7–3.1)
Lymphs: 43 %
MCH: 29.3 pg (ref 26.6–33.0)
MCHC: 32.5 g/dL (ref 31.5–35.7)
MCV: 90 fL (ref 79–97)
Monocytes Absolute: 0.8 10*3/uL (ref 0.1–0.9)
Monocytes: 15 %
Neutrophils Absolute: 1.9 10*3/uL (ref 1.4–7.0)
Neutrophils: 37 %
Platelets: 170 10*3/uL (ref 150–450)
RBC: 5.64 x10E6/uL (ref 4.14–5.80)
RDW: 12.9 % (ref 11.6–15.4)
WBC: 5.2 10*3/uL (ref 3.4–10.8)

## 2023-05-24 LAB — HEMOGLOBIN A1C
Est. average glucose Bld gHb Est-mCnc: 120 mg/dL
Hgb A1c MFr Bld: 5.8 % — ABNORMAL HIGH (ref 4.8–5.6)

## 2023-05-24 LAB — PSA: Prostate Specific Ag, Serum: 1.6 ng/mL (ref 0.0–4.0)

## 2023-05-24 MED ORDER — PRAVASTATIN SODIUM 20 MG PO TABS: 20.0000 mg | ORAL_TABLET | Freq: Every day | ORAL | 2 refills | Status: DC

## 2023-05-24 NOTE — Progress Notes (Signed)
 Results sent through MyChart

## 2023-05-24 NOTE — Telephone Encounter (Signed)
 Pt asks if you can call him about lab results.

## 2023-05-24 NOTE — Telephone Encounter (Signed)
 Went over lab results with patient. He does not want to do weight loss medications at this time.

## 2023-05-31 ENCOUNTER — Telehealth: Payer: Self-pay | Admitting: Medical

## 2023-05-31 DIAGNOSIS — B356 Tinea cruris: Secondary | ICD-10-CM

## 2023-05-31 MED ORDER — TERBINAFINE HCL 1 % EX CREA: 1.0000 | TOPICAL_CREAM | Freq: Two times a day (BID) | CUTANEOUS | 0 refills | Status: DC

## 2023-05-31 NOTE — Telephone Encounter (Signed)
Pt was notified. I have put referral in for dermatology. Pt never picked up the cream as the pharmacy said it they didn't have it. I have resent this to pharmacy

## 2023-05-31 NOTE — Telephone Encounter (Signed)
[facility] called and is asking for a referral to dermatology. When I advised he would need an appointment to discuss he states you already prescribed something for the rash that isn't working so he wanted me to send a message back to see if you would put the referral in.

## 2023-11-27 ENCOUNTER — Ambulatory Visit: Payer: Self-pay | Admitting: Dermatology

## 2023-12-14 NOTE — Procedures (Signed)
 Edward Patterson

## 2023-12-17 ENCOUNTER — Ambulatory Visit: Payer: Self-pay

## 2023-12-17 NOTE — Telephone Encounter (Signed)
 FYI Only or Action Required?: FYI only for provider.  Patient was last seen in primary care on 05/23/2023 by Bulah Alm RAMAN, PA-C.  Called Nurse Triage reporting Leg Injury.  Symptoms began several weeks ago.  Interventions attempted: OTC medications: ibuprofen and Tylenol .  Symptoms are: unchanged.  Triage Disposition: See Within 3 Days in Office (overriding See Physician Within 24 Hours)  Patient/caregiver understands and will follow disposition?: Yes                             Copied from CRM [id number]. Topic: Clinical - Red Word Triage >> Dec 17, 2023  8:41 AM Edward Patterson wrote: Red Word that prompted transfer to Nurse Triage: Pain leg 8/10, thinks its pulled muscle from gym, prevents movent at times.    ----------------------------------------------------------------------- From previous Reason for Contact - Scheduling: Patient/patient representative is calling to schedule an appointment. Refer to attachments for appointment information. Reason for Disposition  [1] MODERATE pain (e.g., interferes with normal activities, limping) AND [2] high-risk adult (e.g., age > 60 years, osteoporosis, chronic steroid use)  Answer Assessment - Initial Assessment Questions 1. MECHANISM: How did the injury happen? (e.g., twisting injury, direct blow)      States he feels like he pulled a muscle at the gym 2. ONSET: When did the injury happen? (e.g., minutes, hours ago)      2 weeks ago 3. LOCATION: Where is the injury located?      Left leg- pain starts at the quad/inner thigh and radiates down  4. APPEARANCE of INJURY: What does the injury look like?  (e.g., deformity of leg)     Denies swelling, denies bruising, leg appears normal 5. SEVERITY: Can you put weight on that leg? Can you walk?      States he can walk, but has to limp or hold onto something for balance 7. PAIN: Is there pain? If Yes, ask: How bad is the pain?   What does it keep you  from doing? (Scale 0-10; or none, mild, moderate, severe)     Rates pain 7-8 at this time, describes pain as dull and warm, states pain comes and goes  9. OTHER SYMPTOMS: Do you have any other symptoms?      Denies numbness/tingling    Scheduled patient for first available appointment in office.  Protocols used: Leg Injury-A-AH

## 2023-12-18 ENCOUNTER — Ambulatory Visit: Admitting: Medical

## 2023-12-18 VITALS — BP 130/88 | HR 107 | Wt 245.4 lb

## 2023-12-18 DIAGNOSIS — M79605 Pain in left leg: Secondary | ICD-10-CM | POA: Diagnosis not present

## 2023-12-18 DIAGNOSIS — S76912A Strain of unspecified muscles, fascia and tendons at thigh level, left thigh, initial encounter: Secondary | ICD-10-CM | POA: Diagnosis not present

## 2023-12-18 MED ORDER — HYDROCODONE-ACETAMINOPHEN 7.5-325 MG PO TABS: 1.0000 | ORAL_TABLET | Freq: Two times a day (BID) | ORAL | 0 refills | Status: AC | PRN

## 2023-12-18 MED ORDER — TIZANIDINE HCL 4 MG PO TABS: 4.0000 mg | ORAL_TABLET | Freq: Two times a day (BID) | ORAL | 0 refills | Status: DC | PRN

## 2023-12-18 MED ORDER — NAPROXEN 375 MG PO TBEC: 1.0000 | DELAYED_RELEASE_TABLET | Freq: Two times a day (BID) | ORAL | 0 refills | Status: AC

## 2023-12-18 NOTE — Progress Notes (Signed)
 Subjective:  Edward Patterson is a 58 y.o. male who presents for Chief Complaint  Patient presents with   Acute Visit    Leg pain x 2 weeks. Pain is getting worse, starting in upper thigh and moving around to back of thigh into the Groin area. Pain level 8-9/10 right now Was doing exercise 2 weeks ago doing high stepping and that's when he noticed pain     Here for some left leg pain x 2 weeks.    Prior to pain was doing workouts include squats, high knee stepping, medicine ball work.   Thinks he pulled a muscle in the thight and groin when he did his recent workout.   He has done that workout prior but this was starting back after a hiatus.  When pain started it started in thigh down whole leg.  Still gets some pains all the way to the foot and anlke but worse in thigh.     Feels a pulling sensatio in left thigh.  Ibuprofen isnt helping much.    No low back pain, mostly pain left anterior and posterior thigh, worse anterior, groin as well.     No leg numnbes or tingling.    No fever, no problems with urination or defectation.    No other aggravating or relieving factors.    No other c/o.  Past Medical History:  Diagnosis Date   Allergy    sinus   Colon cancer (HCC) 08/2014   Stage TIII, N0, invasive adenocarcinoma of hepatic flexure/right colon s/p resection but declined medical oncology f/u   Family history of colon cancer    mother, sister   Hyperlipidemia    Hypertension    Obesity    Obstructive sleep apnea on CPAP    CPAP   Sleep apnea    Current Outpatient Medications on File Prior to Visit  Medication Sig Dispense Refill   aspirin  EC 81 MG tablet Take 1 tablet (81 mg total) by mouth daily. 90 tablet 3   carvedilol  (COREG ) 12.5 MG tablet Take 1 tablet (12.5 mg total) by mouth 2 (two) times daily with a meal. 180 tablet 2   doxazosin  (CARDURA ) 2 MG tablet Take 1 tablet (2 mg total) by mouth daily. 90 tablet 2   irbesartan -hydrochlorothiazide  (AVALIDE) 300-12.5 MG  tablet Take 1 tablet by mouth daily. 90 tablet 2   Multiple Vitamin (MULTIVITAMIN) capsule Take 1 capsule by mouth every morning.      Omega-3 Fatty Acids (FISH OIL PO) Take 1 capsule by mouth daily at 3 pm.      pravastatin  (PRAVACHOL ) 20 MG tablet Take 1 tablet (20 mg total) by mouth daily. 90 tablet 2   terbinafine  (LAMISIL  AT) 1 % cream Apply 1 Application topically 2 (two) times daily. 30 g 0   No current facility-administered medications on file prior to visit.    The following portions of the patient's history were reviewed and updated as appropriate: allergies, current medications, past family history, past medical history, past social history, past surgical history and problem list.  ROS Otherwise as in subjective above   Objective: BP 130/88   Pulse (!) 107   Wt 245 lb 6.4 oz (111.3 kg)   SpO2 99%   BMI 35.21 kg/m   General appearance: alert, no distress, well developed, well nourished Mainly tender in the left anterior and medial upper thigh over sartorius, gracilis and adductor longus area.  Pain with passive and resisted hip flexion and abduction.  No obvious swelling  or bruising or deformity.  Legs otherwise nontender Extremities without edema Back nontender with relatively normal range of motion Legs neurovascularly intact     Assessment: Encounter Diagnoses  Name Primary?   Muscle strain of left thigh, initial encounter Yes   Leg pain, left      Plan: Your injury suggest a muscle strain injury to your left thigh  Recommendations: Rest the leg for the next several days You can use heat to the leg You should do stretches though.  I recommend you stretch a couple times daily You can use Naprosyn  375 mg twice daily for pain and inflammation for the next 5 days.  After 5 days you can use this as needed Begin tizanidine  muscle relaxer once or twice a day for the next 3 to 5 days for muscle tension.  Caution this can make you sleepy For worse pain, you can  use short term Norco Hydrocodone  once or twice daily for breakthrough pain.  Caution this can make you sleepy as well.  Do not take this within 3 hours of the tizanidine  muscle relaxer Over the next 3 to 4 days as your pain improves work on increasing your range of motion and stretching and do more walking as the pain is getting less and less By Friday if feeling improved then start using the stationary bike 15 to 20 minutes if possible You should be able to return to work next Monday  Note given for work   Edward Patterson was seen today for acute visit.  Diagnoses and all orders for this visit:  Muscle strain of left thigh, initial encounter  Leg pain, left  Other orders -     HYDROcodone -acetaminophen  (NORCO) 7.5-325 MG tablet; Take 1 tablet by mouth 2 (two) times daily as needed for up to 5 days for moderate pain (pain score 4-6). -     Naproxen  375 MG TBEC; Take 1 tablet (375 mg total) by mouth in the morning and at bedtime. -     tiZANidine  (ZANAFLEX ) 4 MG tablet; Take 1 tablet (4 mg total) by mouth 2 (two) times daily as needed for muscle spasms.    Follow up: prn

## 2023-12-18 NOTE — Patient Instructions (Signed)
 Your injury suggest a muscle strain injury to your left thigh  Recommendations: Rest the leg for the next several days You can use heat to the leg You should do stretches though.  I recommend you stretch a couple times daily You can use Naprosyn  375 mg twice daily for pain and inflammation for the next 5 days.  After 5 days you can use this as needed Begin tizanidine  muscle relaxer once or twice a day for the next 3 to 5 days for muscle tension.  Caution this can make you sleepy For worse pain, you can use short term Norco Hydrocodone  once or twice daily for breakthrough pain.  Caution this can make you sleepy as well.  Do not take this within 3 hours of the tizanidine  muscle relaxer Over the next 3 to 4 days as your pain improves work on increasing your range of motion and stretching and do more walking as the pain is getting less and less By Friday if feeling improved then start using the stationary bike 15 to 20 minutes if possible You should be able to return to work next Monday  Note given for work

## 2023-12-19 ENCOUNTER — Telehealth: Payer: Self-pay | Admitting: Medical

## 2023-12-19 NOTE — Telephone Encounter (Signed)
 [employer] FMLA forms received via fax and sent back in folder

## 2023-12-21 ENCOUNTER — Telehealth: Payer: Self-pay | Admitting: Medical

## 2023-12-21 NOTE — Telephone Encounter (Signed)
 Copied from CRM #[id number]. Topic: General - Other >> Dec 21, 2023 12:12 PM Charlet HERO wrote: Reason for CRM: Patient is calling to verify his Fmla days 8/6, 8/8,8/9 and 8/10 to return 8/11 >> Dec 21, 2023  2:28 PM Donna E wrote: Patient received message in MyChart and wants to know what is going on.  Patient is asking if he can return to work on Monday 12/24/23   Patient asked where to pick up a heating pad, informed patient [facility] carries heating pads, he stated he will pick one up at [facility]

## 2023-12-21 NOTE — Telephone Encounter (Signed)
 Per patient he was out of work 8/6,8/7 & 8/8 and you have him returning to work on Monday 8/11

## 2023-12-26 NOTE — Telephone Encounter (Signed)
 Emailed FMLA forms to to company on 8/12,  sent My Chart message to patient to make him aware

## 2023-12-28 ENCOUNTER — Telehealth: Payer: Self-pay | Admitting: Medical

## 2023-12-28 NOTE — Telephone Encounter (Signed)
 FMLA forms from [employer] received back via fax, page 4 not completed. Sent forms back in folder

## 2024-01-01 NOTE — Telephone Encounter (Signed)
 Faxed FMLA forms again to  [employer] with pg 4 completed

## 2024-01-03 ENCOUNTER — Telehealth: Payer: Self-pay

## 2024-01-03 NOTE — Telephone Encounter (Signed)
 FMLA forms in your folder for Edward Patterson.

## 2024-02-03 ENCOUNTER — Other Ambulatory Visit: Payer: Self-pay | Admitting: Medical

## 2024-02-04 ENCOUNTER — Other Ambulatory Visit: Payer: Self-pay | Admitting: Medical

## 2024-02-04 MED ORDER — CARVEDILOL 12.5 MG PO TABS: 12.5000 mg | ORAL_TABLET | Freq: Two times a day (BID) | ORAL | 0 refills | Status: DC

## 2024-02-04 NOTE — Telephone Encounter (Signed)
 Copied from CRM [id number]. Topic: Clinical - Medication Refill >> Feb 04, 2024 11:34 AM Antwanette L wrote: Medication: carvedilol  (COREG ) 12.5 MG tablet   Has the patient contacted their pharmacy? Yes  This is the patient's preferred pharmacy:  [id number] [place], [place] - [address] [place] [address] Phone: [phone] Fax: [phone]  Is this the correct pharmacy for this prescription? Yes   Has the prescription been filled recently? Yes. Last refill was on 05/23/23  Is the patient out of the medication? Yes  Has the patient been seen for an appointment in the last year OR does the patient have an upcoming appointment? Yes. Last ov w/ Alm Gent PA-C was on 12/18/23  Can we respond through MyChart? No. Contact the patient by phone at [phone]  Agent: Please be advised that Rx refills may take up to 3 business days. We ask that you follow-up with your pharmacy.

## 2024-02-04 NOTE — Telephone Encounter (Signed)
 Pt was seen for jock itch in January and was given this medication. Pt completed this already

## 2024-03-11 ENCOUNTER — Other Ambulatory Visit: Payer: Self-pay | Admitting: Medical

## 2024-03-11 ENCOUNTER — Telehealth: Payer: Self-pay | Admitting: Medical

## 2024-03-11 MED ORDER — PRAVASTATIN SODIUM 20 MG PO TABS: 20.0000 mg | ORAL_TABLET | Freq: Every day | ORAL | 2 refills | Status: DC

## 2024-03-11 NOTE — Telephone Encounter (Signed)
 Copied from CRM [id number]. Topic: Clinical - Medication Refill >> Mar 11, 2024  2:29 PM Charlet HERO wrote: Medication: pravastatin  (PRAVACHOL ) 20 MG tablet  Has the patient contacted their pharmacy? Yes Pharmacy is stating that did not respond but they have not sent in a request  This is the patient's preferred pharmacy:  [id number] [place], [place] - [address] [place] [address] Phone: [phone] Fax: [phone]  Is this the correct pharmacy for this prescription? Yes If no, delete pharmacy and type the correct one.   Has the prescription been filled recently? Yes  Is the patient out of the medication? Yes  Has the patient been seen for an appointment in the last year OR does the patient have an upcoming appointment? Yes  Can we respond through MyChart? No  Agent: Please be advised that Rx refills may take up to 3 business days. We ask that you follow-up with your pharmacy.

## 2024-05-20 ENCOUNTER — Telehealth: Payer: Self-pay | Admitting: Internal Medicine

## 2024-05-20 NOTE — Telephone Encounter (Signed)
 Sent to [place] for him to decide.

## 2024-05-20 NOTE — Telephone Encounter (Signed)
 Copied from CRM [id number]. Topic: Clinical - Request for Lab/Test Order >> May 20, 2024  9:11 AM Edward Patterson wrote: Reason for CRM: The patient would like to know if they'll need to be seen for labwork prior to their appointment scheduled for 05/27/24  Please contact further if/when possible

## 2024-05-22 ENCOUNTER — Other Ambulatory Visit: Payer: Self-pay | Admitting: Medical

## 2024-05-22 DIAGNOSIS — E7800 Pure hypercholesterolemia, unspecified: Secondary | ICD-10-CM

## 2024-05-22 DIAGNOSIS — I10 Essential (primary) hypertension: Secondary | ICD-10-CM

## 2024-05-22 DIAGNOSIS — Z1389 Encounter for screening for other disorder: Secondary | ICD-10-CM

## 2024-05-22 DIAGNOSIS — Z0000 Encounter for general adult medical examination without abnormal findings: Secondary | ICD-10-CM

## 2024-05-22 DIAGNOSIS — Z125 Encounter for screening for malignant neoplasm of prostate: Secondary | ICD-10-CM

## 2024-05-22 DIAGNOSIS — R7301 Impaired fasting glucose: Secondary | ICD-10-CM | POA: Insufficient documentation

## 2024-05-22 NOTE — Telephone Encounter (Signed)
 Left message for pt to call back to schedule lab visit as Ludie has placed labs

## 2024-05-23 ENCOUNTER — Other Ambulatory Visit

## 2024-05-23 DIAGNOSIS — E7800 Pure hypercholesterolemia, unspecified: Secondary | ICD-10-CM

## 2024-05-23 DIAGNOSIS — Z0000 Encounter for general adult medical examination without abnormal findings: Secondary | ICD-10-CM

## 2024-05-23 DIAGNOSIS — Z125 Encounter for screening for malignant neoplasm of prostate: Secondary | ICD-10-CM

## 2024-05-23 DIAGNOSIS — Z1389 Encounter for screening for other disorder: Secondary | ICD-10-CM

## 2024-05-23 DIAGNOSIS — I10 Essential (primary) hypertension: Secondary | ICD-10-CM

## 2024-05-23 DIAGNOSIS — R7301 Impaired fasting glucose: Secondary | ICD-10-CM

## 2024-05-24 LAB — CBC WITH DIFFERENTIAL/PLATELET
Basophils Absolute: 0 x10E3/uL (ref 0.0–0.2)
Basos: 1 %
EOS (ABSOLUTE): 0.1 x10E3/uL (ref 0.0–0.4)
Eos: 2 %
Hematocrit: 51.7 % — ABNORMAL HIGH (ref 37.5–51.0)
Hemoglobin: 17.1 g/dL (ref 13.0–17.7)
Immature Grans (Abs): 0 x10E3/uL (ref 0.0–0.1)
Immature Granulocytes: 0 %
Lymphocytes Absolute: 1.8 x10E3/uL (ref 0.7–3.1)
Lymphs: 44 %
MCH: 29.3 pg (ref 26.6–33.0)
MCHC: 33.1 g/dL (ref 31.5–35.7)
MCV: 89 fL (ref 79–97)
Monocytes Absolute: 0.5 x10E3/uL (ref 0.1–0.9)
Monocytes: 11 %
Neutrophils Absolute: 1.8 x10E3/uL (ref 1.4–7.0)
Neutrophils: 42 %
Platelets: 183 x10E3/uL (ref 150–450)
RBC: 5.84 x10E6/uL — ABNORMAL HIGH (ref 4.14–5.80)
RDW: 13 % (ref 11.6–15.4)
WBC: 4.1 x10E3/uL (ref 3.4–10.8)

## 2024-05-24 LAB — COMPREHENSIVE METABOLIC PANEL WITH GFR
ALT: 29 IU/L (ref 0–44)
AST: 23 IU/L (ref 0–40)
Albumin: 4.2 g/dL (ref 3.8–4.9)
Alkaline Phosphatase: 91 IU/L (ref 47–123)
BUN/Creatinine Ratio: 17 (ref 10–24)
BUN: 16 mg/dL (ref 8–27)
Bilirubin Total: 1 mg/dL (ref 0.0–1.2)
CO2: 24 mmol/L (ref 20–29)
Calcium: 9.2 mg/dL (ref 8.6–10.2)
Chloride: 101 mmol/L (ref 96–106)
Creatinine, Ser: 0.92 mg/dL (ref 0.76–1.27)
Globulin, Total: 2.6 g/dL (ref 1.5–4.5)
Glucose: 97 mg/dL (ref 70–99)
Potassium: 4.1 mmol/L (ref 3.5–5.2)
Sodium: 138 mmol/L (ref 134–144)
Total Protein: 6.8 g/dL (ref 6.0–8.5)
eGFR: 95 mL/min/1.73

## 2024-05-24 LAB — LIPID PANEL
Chol/HDL Ratio: 2.8 ratio (ref 0.0–5.0)
Cholesterol, Total: 169 mg/dL (ref 100–199)
HDL: 60 mg/dL
LDL Chol Calc (NIH): 97 mg/dL (ref 0–99)
Triglycerides: 63 mg/dL (ref 0–149)
VLDL Cholesterol Cal: 12 mg/dL (ref 5–40)

## 2024-05-24 LAB — URINALYSIS, ROUTINE W REFLEX MICROSCOPIC
Bilirubin, UA: NEGATIVE
Glucose, UA: NEGATIVE
Ketones, UA: NEGATIVE
Leukocytes,UA: NEGATIVE
Nitrite, UA: NEGATIVE
Protein,UA: NEGATIVE
RBC, UA: NEGATIVE
Urobilinogen, Ur: 0.2 mg/dL (ref 0.2–1.0)
pH, UA: 7 (ref 5.0–7.5)

## 2024-05-24 LAB — PSA: Prostate Specific Ag, Serum: 1.7 ng/mL (ref 0.0–4.0)

## 2024-05-24 LAB — HEMOGLOBIN A1C
Est. average glucose Bld gHb Est-mCnc: 120 mg/dL
Hgb A1c MFr Bld: 5.8 % — ABNORMAL HIGH (ref 4.8–5.6)

## 2024-05-26 ENCOUNTER — Other Ambulatory Visit: Payer: Self-pay

## 2024-05-27 ENCOUNTER — Ambulatory Visit: Payer: Self-pay | Admitting: Medical

## 2024-05-27 ENCOUNTER — Ambulatory Visit: Payer: 59 | Admitting: Medical

## 2024-05-27 ENCOUNTER — Telehealth: Payer: Self-pay | Admitting: Internal Medicine

## 2024-05-27 VITALS — BP 170/110 | HR 78 | Ht 70.0 in | Wt 243.6 lb

## 2024-05-27 DIAGNOSIS — Z23 Encounter for immunization: Secondary | ICD-10-CM | POA: Diagnosis not present

## 2024-05-27 DIAGNOSIS — K602 Anal fissure, unspecified: Secondary | ICD-10-CM

## 2024-05-27 DIAGNOSIS — I10 Essential (primary) hypertension: Secondary | ICD-10-CM | POA: Diagnosis not present

## 2024-05-27 DIAGNOSIS — E7800 Pure hypercholesterolemia, unspecified: Secondary | ICD-10-CM | POA: Diagnosis not present

## 2024-05-27 DIAGNOSIS — Z0000 Encounter for general adult medical examination without abnormal findings: Secondary | ICD-10-CM | POA: Diagnosis not present

## 2024-05-27 DIAGNOSIS — Z85038 Personal history of other malignant neoplasm of large intestine: Secondary | ICD-10-CM

## 2024-05-27 DIAGNOSIS — R7301 Impaired fasting glucose: Secondary | ICD-10-CM | POA: Diagnosis not present

## 2024-05-27 DIAGNOSIS — G4733 Obstructive sleep apnea (adult) (pediatric): Secondary | ICD-10-CM | POA: Diagnosis not present

## 2024-05-27 MED ORDER — DOXAZOSIN MESYLATE 2 MG PO TABS: 2.0000 mg | ORAL_TABLET | Freq: Every day | ORAL | 3 refills | Status: AC

## 2024-05-27 MED ORDER — CARVEDILOL 12.5 MG PO TABS: 12.5000 mg | ORAL_TABLET | Freq: Two times a day (BID) | ORAL | 3 refills | Status: AC

## 2024-05-27 MED ORDER — ASPIRIN 81 MG PO TBEC: 81.0000 mg | DELAYED_RELEASE_TABLET | Freq: Every day | ORAL | 3 refills | Status: AC

## 2024-05-27 MED ORDER — IRBESARTAN-HYDROCHLOROTHIAZIDE 300-12.5 MG PO TABS: 1.0000 | ORAL_TABLET | Freq: Every day | ORAL | 3 refills | Status: AC

## 2024-05-27 MED ORDER — PRAVASTATIN SODIUM 20 MG PO TABS: 20.0000 mg | ORAL_TABLET | Freq: Every day | ORAL | 3 refills | Status: AC

## 2024-05-27 MED ORDER — HYDROCORTISONE 2.5 % EX CREA: TOPICAL_CREAM | Freq: Two times a day (BID) | CUTANEOUS | 0 refills | Status: AC

## 2024-05-27 NOTE — Telephone Encounter (Signed)
 Copied from CRM [id number]. Topic: Clinical - Lab/Test Results >> May 27, 2024  4:51 PM Hadassah PARAS wrote: Pt was in office today and was advised a nurse will be reaching out and following up on pt. Please call pt on #[phone]

## 2024-05-27 NOTE — Patient Instructions (Signed)
 Hypertension Blood pressure elevated despite medication adherence. Recent refill issues may have contributed. - Rechecked blood pressure before leaving clinic. - Scheduled follow-up in one month to reassess. - Call us  in the future if there is an issue at the pharmacy  Anal fissure Tenderness and minor bleeding likely due to perineal fissure - Use some hot soaks in soapy water, 15 to 20 minutes once or twice daily for the next few days -Wipe with wet wipes instead of regular toilet paper in the short term - Begin short-term use of hydrocortisone  cream once or twice a day for the next 3 to 5 days -Make sure you show this area to Dr. Legrand when you go for colonoscopy in the near future  Prediabetes Elevated diabetes marker indicates prediabetes. Weight loss recommended. - Recommended weight loss through diet and exercise. - Consider medication to help with your efforts.  We can do this if you are agreeable  Obesity associated with HTN Contributing to elevated blood pressure and prediabetes. Weight loss crucial. - Recommended weight loss through diet and exercise.  Obstructive sleep apnea Continues CPAP use. Recent technical issue resolved. - Continue using CPAP machine.  Hyperlipidemia Cholesterol levels well-controlled with current regimen. - Continue current medication regimen.  General Health Maintenance Routine health maintenance up to date with shingles vaccine. Due for tetanus and pneumococcal vaccines. Colonoscopy due for repeat screening. - Administered pneumococcal vaccine today. - Plan for tetanus vaccine later this year. - Schedule colonoscopy for repeat screening.  Moderate size skin lesion/skin tag right lower back -Return in a month to recheck on blood pressure and to remove the skin lesion

## 2024-05-27 NOTE — Progress Notes (Signed)
 Diabetes marker elevated at risk for diabetes but stable.  Liver kidney and electrolytes normal.  Cholesterol looks good.  Prostate marker normal.  Red cells slightly elevated but this has been this way in the past unchanged and mildly elevated.  Rest of blood counts okay.  Still pending microalbumin lab

## 2024-05-27 NOTE — Progress Notes (Signed)
 "  Name: Edward Patterson   Date of Visit: 05/27/2024   Date of last visit with me: 03/11/2024   CHIEF COMPLAINT:  Chief Complaint  Patient presents with   Annual Exam    Cpe, had labs the other day,        HPI:  Discussed the use of AI scribe software for clinical note transcription with the patient, who gave verbal consent to proceed.  History of Present Illness  Edward Patterson is a 59 year old male with hypertension who presents for a routine physical exam and medication management.  He has experienced elevated blood pressure readings recently, despite taking his medications, which include carvedilol  12.5 mg twice a day, irbesartan  HCT 300/12.5 mg daily, and Cardura  2 mg daily. He had a lapse in medication due to refill issues but has since resumed his regimen. His blood pressure was noted to be high during a recent visit to a mini clinic.  He describes tenderness and a small amount of blood at the tip of his rectum. He has been applying peroxide and alcohol to the area.  He uses a CPAP machine for sleep apnea. He continues to take Pravachol  20 mg daily for cholesterol, along with over-the-counter fish oil, a multivitamin, and aspirin  81 mg daily.  He has a history of colon issues and underwent a colonoscopy in 2022. He does not currently see an oncologist or gastroenterologist.  Allergies[1]  Past Medical History:  Diagnosis Date   Allergy    sinus   Colon cancer (HCC) 08/2014   Stage TIII, N0, invasive adenocarcinoma of hepatic flexure/right colon s/p resection but declined medical oncology f/u   Family history of colon cancer    mother, sister   Hyperlipidemia    Hypertension    Obesity    Obstructive sleep apnea on CPAP    CPAP   Sleep apnea     Medications Ordered Prior to Encounter[2]   Current Medications[3]  Family History  Problem Relation Age of Onset   Hypertension Mother    Colon cancer Mother 73       died at 56   Colon cancer Sister 92    Diabetes Father    Edema Sister    Diabetes Maternal Aunt    Cancer Maternal Uncle        type unk- died in his 18's, hx of smoking   Heart disease Neg Hx    Stroke Neg Hx    Hyperlipidemia Neg Hx    Esophageal cancer Neg Hx    Rectal cancer Neg Hx    Stomach cancer Neg Hx     Past Surgical History:  Procedure Laterality Date   APPENDECTOMY  08/2014   along with right colectomy   COLONOSCOPY  10/2016   polpys, repeat in 3 years.;  Dr. Victory Brand   COLONOSCOPY WITH PROPOFOL  N/A 07/13/2014   Procedure: COLONOSCOPY WITH PROPOFOL ;  Surgeon: Lamar JONETTA Aho, MD;  Location: [facility];  Service: Endoscopy;  Laterality: N/A;   HOT HEMOSTASIS N/A 07/13/2014   Procedure: HOT HEMOSTASIS (ARGON PLASMA COAGULATION/BICAP);  Surgeon: Lamar JONETTA Aho, MD;  Location: [facility];  Service: Endoscopy;  Laterality: N/A;   LAPAROSCOPIC PARTIAL COLECTOMY N/A 08/28/2014   Procedure: LAPAROSCOPIC ASSISTED right COLECTOMY, ;  Surgeon: Elon Pacini, MD;  Location: [facility];  Service: General;  Laterality: N/A;   WISDOM TOOTH EXTRACTION      ROS as in subjective   Objective: BP (!) 170/110   Pulse 78   Ht  5' 10 (1.778 m)   Wt 243 lb 9.6 oz (110.5 kg)   SpO2 97%   BMI 34.95 kg/m    General appearence: alert, no distress, WD/WN, African-American male Skin: Scattered macules and skin tags, right lower back with a pedunculated skin lesion consistent with skin tag present for many years, the base is approximately 4 mm diameter but the lesion itself is approximately 1.5 cm diameter HEENT: normocephalic, sclerae anicteric, PERRLA, EOMi, nares patent, no discharge or erythema, pharynx normal Oral cavity: MMM, no lesions Neck: supple, no lymphadenopathy, no thyromegaly, no masses, no bruits Heart: RRR, normal S1, S2, no murmurs Lungs: CTA bilaterally, no wheezes, rhonchi, or rales Abdomen: +bs, soft, lower vertical surgical scar in the area of the umbilicus, non tender, non distended, no masses, no  hepatomegaly, no splenomegaly Back: non tender Musculoskeletal: nontender, no swelling, no obvious deformity Extremities: no edema, no cyanosis, no clubbing Pulses: 2+ symmetric, upper and lower extremities, normal cap refill Neurological: alert, oriented x 3, CN2-12 intact, strength normal upper extremities and lower extremities, sensation normal throughout, DTRs 2+ throughout, no cerebellar signs, gait normal Psychiatric: normal affect, behavior normal, pleasant  GU: Normal male, no mass, no lymphadenopathy, no hernia Rectal: There is a fleshy lesion at the anterior portion of the anus likely a skin tag with some irritated area and a small fissure anterior to that, tender.  Otherwise anus normal appearing  EKG reviewed     Assessment: Encounter Diagnoses  Name Primary?   Routine general medical examination at a health care facility    Need for vaccination against Streptococcus pneumoniae    OSA (obstructive sleep apnea)    Morbid obesity (HCC)    Impaired fasting blood sugar    Pure hypercholesterolemia    History of colon cancer, stage III    Essential hypertension    Perineal fissure Yes     Plan:  Hypertension Blood pressure elevated despite medication adherence. Recent refill issues may have contributed. - Rechecked blood pressure before leaving clinic. - Scheduled follow-up in one month to reassess. - Call us  in the future if there is an issue at the pharmacy  Anal fissure Tenderness and minor bleeding likely due to perineal fissure - Use some hot soaks in soapy water, 15 to 20 minutes once or twice daily for the next few days -Wipe with wet wipes instead of regular toilet paper in the short term - Begin short-term use of hydrocortisone  cream once or twice a day for the next 3 to 5 days -Make sure you show this area to Dr. Legrand when you go for colonoscopy in the near future  Prediabetes Elevated diabetes marker indicates prediabetes. Weight loss recommended. -  Recommended weight loss through diet and exercise. - Consider medication to help with your efforts.  We can do this if you are agreeable  Obesity associated with HTN Contributing to elevated blood pressure and prediabetes. Weight loss crucial. - Recommended weight loss through diet and exercise.  Obstructive sleep apnea Continues CPAP use. Recent technical issue resolved. - Continue using CPAP machine.  Hyperlipidemia Cholesterol levels well-controlled with current regimen. - Continue current medication regimen.  General Health Maintenance Routine health maintenance up to date with shingles vaccine. Due for tetanus and pneumococcal vaccines. Colonoscopy due for repeat screening. - Administered pneumococcal vaccine today. - Plan for tetanus vaccine later this year. - Schedule colonoscopy for repeat screening.  Moderate size skin lesion/skin tag right lower back -Return in a month to recheck on blood pressure and  to remove the skin lesion  Jabarri was seen today for annual exam.  Diagnoses and all orders for this visit:  Perineal fissure  Routine general medical examination at a health care facility -     EKG 12-Lead  Need for vaccination against Streptococcus pneumoniae -     Pneumococcal conjugate vaccine 20-valent (PCV20)  OSA (obstructive sleep apnea)  Morbid obesity (HCC)  Impaired fasting blood sugar  Pure hypercholesterolemia  History of colon cancer, stage III -     Ambulatory referral to Gastroenterology  Essential hypertension -     EKG 12-Lead  Other orders -     hydrocortisone  2.5 % cream; Apply topically 2 (two) times daily. -     pravastatin  (PRAVACHOL ) 20 MG tablet; Take 1 tablet (20 mg total) by mouth daily. -     irbesartan -hydrochlorothiazide  (AVALIDE) 300-12.5 MG tablet; Take 1 tablet by mouth daily. -     doxazosin  (CARDURA ) 2 MG tablet; Take 1 tablet (2 mg total) by mouth daily. -     carvedilol  (COREG ) 12.5 MG tablet; Take 1 tablet (12.5 mg  total) by mouth 2 (two) times daily with a meal. -     aspirin  EC 81 MG tablet; Take 1 tablet (81 mg total) by mouth daily.    F/u 26mo for BP check and skin lesion on back       [1] No Known Allergies [2]  Current Outpatient Medications on File Prior to Visit  Medication Sig Dispense Refill   Multiple Vitamin (MULTIVITAMIN) capsule Take 1 capsule by mouth every morning.      Naproxen  375 MG TBEC Take 1 tablet (375 mg total) by mouth in the morning and at bedtime. 20 tablet 0   Omega-3 Fatty Acids (FISH OIL PO) Take 1 capsule by mouth daily at 3 pm.      No current facility-administered medications on file prior to visit.  [3]  Current Outpatient Medications:    hydrocortisone  2.5 % cream, Apply topically 2 (two) times daily., Disp: 30 g, Rfl: 0   Multiple Vitamin (MULTIVITAMIN) capsule, Take 1 capsule by mouth every morning. , Disp: , Rfl:    Naproxen  375 MG TBEC, Take 1 tablet (375 mg total) by mouth in the morning and at bedtime., Disp: 20 tablet, Rfl: 0   Omega-3 Fatty Acids (FISH OIL PO), Take 1 capsule by mouth daily at 3 pm. , Disp: , Rfl:    aspirin  EC 81 MG tablet, Take 1 tablet (81 mg total) by mouth daily., Disp: 90 tablet, Rfl: 3   carvedilol  (COREG ) 12.5 MG tablet, Take 1 tablet (12.5 mg total) by mouth 2 (two) times daily with a meal., Disp: 180 tablet, Rfl: 3   doxazosin  (CARDURA ) 2 MG tablet, Take 1 tablet (2 mg total) by mouth daily., Disp: 90 tablet, Rfl: 3   irbesartan -hydrochlorothiazide  (AVALIDE) 300-12.5 MG tablet, Take 1 tablet by mouth daily., Disp: 90 tablet, Rfl: 3   pravastatin  (PRAVACHOL ) 20 MG tablet, Take 1 tablet (20 mg total) by mouth daily., Disp: 90 tablet, Rfl: 3  "

## 2024-05-27 NOTE — Telephone Encounter (Unsigned)
 Copied from CRM [id number]. Topic: Clinical - Lab/Test Results >> May 27, 2024  3:24 PM Drema MATSU wrote: Reason for CRM: Patient is requesting a callback regarding EKG. He stated that he received an email about it. Please call pt back.

## 2024-05-28 ENCOUNTER — Telehealth: Payer: Self-pay | Admitting: Family Medicine

## 2024-05-28 NOTE — Telephone Encounter (Signed)
 See message from 05/27/2024. Waiting for Ludie to review EKG and tell me results to patient

## 2024-05-28 NOTE — Telephone Encounter (Signed)
 Pt was notified.

## 2024-05-28 NOTE — Telephone Encounter (Signed)
 Copied from CRM [id number]. Topic: Clinical - Lab/Test Results >> May 27, 2024  3:24 PM Drema MATSU wrote: Reason for CRM: Patient is requesting a callback regarding EKG. He stated that he received an email about it. Please call pt back. >> May 27, 2024  4:51 PM Hadassah PARAS wrote: Pt was in office today and was advised a nurse will be reaching out and following up on pt. Please call pt on #[phone]

## 2024-05-28 NOTE — Telephone Encounter (Signed)
 Copied from CRM [id number]. Topic: Clinical - Lab/Test Results >> May 27, 2024  3:24 PM Drema MATSU wrote: Reason for CRM: Patient is requesting a callback regarding EKG. He stated that he received an email about it. Please call pt back.

## 2024-05-28 NOTE — Telephone Encounter (Signed)
 See message from 05/27/2024. This was sent to shane yesterday. waaiting for Ludie to review EKG and tell me results to patient

## 2024-06-19 ENCOUNTER — Encounter: Payer: Self-pay | Admitting: Gastroenterology

## 2024-08-05 ENCOUNTER — Encounter

## 2024-08-19 ENCOUNTER — Encounter: Admitting: Gastroenterology

## 2024-08-25 ENCOUNTER — Encounter: Admitting: Gastroenterology
